# Patient Record
Sex: Female | Born: 1970
Health system: Southern US, Community
[De-identification: ages and names within clinical notes are randomized; demographics above are authoritative.]

## PROBLEM LIST (undated history)

## (undated) DIAGNOSIS — N841 Polyp of cervix uteri: Secondary | ICD-10-CM

## (undated) DIAGNOSIS — G43909 Migraine, unspecified, not intractable, without status migrainosus: Secondary | ICD-10-CM

## (undated) DIAGNOSIS — M545 Low back pain, unspecified: Secondary | ICD-10-CM

## (undated) DIAGNOSIS — M1990 Unspecified osteoarthritis, unspecified site: Secondary | ICD-10-CM

## (undated) HISTORY — PX: CHOLECYSTECTOMY: SHX55

## (undated) HISTORY — DX: Low back pain, unspecified: M54.50

## (undated) HISTORY — DX: Low back pain: M54.5

## (undated) HISTORY — PX: OTHER SURGICAL HISTORY: SHX169

## (undated) HISTORY — DX: Polyp of cervix uteri: N84.1

## (undated) HISTORY — PX: TUBAL LIGATION: SHX77

---

## 2004-02-23 ENCOUNTER — Other Ambulatory Visit: Admission: RE | Admit: 2004-02-23 | Discharge: 2004-02-23 | Payer: Self-pay | Admitting: Obstetrics and Gynecology

## 2004-03-24 ENCOUNTER — Encounter (HOSPITAL_COMMUNITY): Admission: RE | Admit: 2004-03-24 | Discharge: 2004-04-23 | Payer: Self-pay | Admitting: Family Medicine

## 2004-04-27 ENCOUNTER — Encounter (HOSPITAL_COMMUNITY): Admission: RE | Admit: 2004-04-27 | Discharge: 2004-05-27 | Payer: Self-pay | Admitting: Family Medicine

## 2004-09-17 ENCOUNTER — Ambulatory Visit (HOSPITAL_COMMUNITY): Admission: RE | Admit: 2004-09-17 | Discharge: 2004-09-17 | Payer: Self-pay | Admitting: Family Medicine

## 2005-10-27 ENCOUNTER — Ambulatory Visit: Payer: Self-pay | Admitting: Orthopedic Surgery

## 2006-01-11 ENCOUNTER — Ambulatory Visit: Payer: Self-pay | Admitting: Orthopedic Surgery

## 2006-01-24 ENCOUNTER — Encounter: Admission: RE | Admit: 2006-01-24 | Discharge: 2006-01-24 | Payer: Self-pay | Admitting: Orthopedic Surgery

## 2006-02-14 ENCOUNTER — Encounter: Admission: RE | Admit: 2006-02-14 | Discharge: 2006-02-14 | Payer: Self-pay | Admitting: Orthopedic Surgery

## 2006-04-05 ENCOUNTER — Encounter (INDEPENDENT_AMBULATORY_CARE_PROVIDER_SITE_OTHER): Payer: Self-pay | Admitting: Specialist

## 2006-04-05 ENCOUNTER — Ambulatory Visit (HOSPITAL_COMMUNITY): Admission: RE | Admit: 2006-04-05 | Discharge: 2006-04-05 | Payer: Self-pay | Admitting: Obstetrics & Gynecology

## 2007-01-01 ENCOUNTER — Ambulatory Visit: Payer: Self-pay | Admitting: Orthopedic Surgery

## 2007-01-05 ENCOUNTER — Ambulatory Visit (HOSPITAL_COMMUNITY): Admission: RE | Admit: 2007-01-05 | Discharge: 2007-01-05 | Payer: Self-pay | Admitting: Orthopedic Surgery

## 2007-01-11 ENCOUNTER — Ambulatory Visit: Payer: Self-pay | Admitting: Orthopedic Surgery

## 2007-02-01 ENCOUNTER — Ambulatory Visit: Payer: Self-pay | Admitting: Orthopedic Surgery

## 2007-03-19 ENCOUNTER — Ambulatory Visit: Payer: Self-pay | Admitting: Orthopedic Surgery

## 2007-03-29 ENCOUNTER — Ambulatory Visit (HOSPITAL_COMMUNITY): Admission: RE | Admit: 2007-03-29 | Discharge: 2007-03-29 | Payer: Self-pay | Admitting: Orthopedic Surgery

## 2007-04-05 ENCOUNTER — Ambulatory Visit: Payer: Self-pay | Admitting: Orthopedic Surgery

## 2007-04-26 ENCOUNTER — Encounter: Admission: RE | Admit: 2007-04-26 | Discharge: 2007-04-26 | Payer: Self-pay | Admitting: Orthopedic Surgery

## 2007-07-12 ENCOUNTER — Encounter: Admission: RE | Admit: 2007-07-12 | Discharge: 2007-07-12 | Payer: Self-pay | Admitting: Orthopedic Surgery

## 2007-10-23 ENCOUNTER — Encounter: Admission: RE | Admit: 2007-10-23 | Discharge: 2007-10-23 | Payer: Self-pay | Admitting: Orthopedic Surgery

## 2008-02-25 ENCOUNTER — Emergency Department (HOSPITAL_COMMUNITY): Admission: EM | Admit: 2008-02-25 | Discharge: 2008-02-25 | Payer: Self-pay | Admitting: Emergency Medicine

## 2008-03-03 ENCOUNTER — Ambulatory Visit: Payer: Self-pay | Admitting: Internal Medicine

## 2008-03-03 DIAGNOSIS — J309 Allergic rhinitis, unspecified: Secondary | ICD-10-CM | POA: Insufficient documentation

## 2008-03-03 DIAGNOSIS — K5900 Constipation, unspecified: Secondary | ICD-10-CM | POA: Insufficient documentation

## 2008-03-03 DIAGNOSIS — K219 Gastro-esophageal reflux disease without esophagitis: Secondary | ICD-10-CM | POA: Insufficient documentation

## 2008-03-03 DIAGNOSIS — F17200 Nicotine dependence, unspecified, uncomplicated: Secondary | ICD-10-CM

## 2008-03-03 DIAGNOSIS — M545 Low back pain: Secondary | ICD-10-CM

## 2008-03-03 DIAGNOSIS — M129 Arthropathy, unspecified: Secondary | ICD-10-CM | POA: Insufficient documentation

## 2009-04-27 ENCOUNTER — Emergency Department (HOSPITAL_COMMUNITY): Admission: EM | Admit: 2009-04-27 | Discharge: 2009-04-27 | Payer: Self-pay | Admitting: Emergency Medicine

## 2009-04-28 ENCOUNTER — Ambulatory Visit: Payer: Self-pay | Admitting: Internal Medicine

## 2009-05-14 ENCOUNTER — Ambulatory Visit: Payer: Self-pay | Admitting: Internal Medicine

## 2009-05-29 ENCOUNTER — Ambulatory Visit: Payer: Self-pay | Admitting: Internal Medicine

## 2009-06-02 ENCOUNTER — Ambulatory Visit: Payer: Self-pay | Admitting: Family Medicine

## 2009-06-03 ENCOUNTER — Ambulatory Visit: Payer: Self-pay | Admitting: Cardiology

## 2009-06-04 ENCOUNTER — Encounter: Payer: Self-pay | Admitting: Cardiology

## 2009-06-04 ENCOUNTER — Ambulatory Visit: Payer: Self-pay | Admitting: Cardiology

## 2009-06-04 ENCOUNTER — Ambulatory Visit (HOSPITAL_COMMUNITY): Admission: RE | Admit: 2009-06-04 | Discharge: 2009-06-04 | Payer: Self-pay | Admitting: Cardiology

## 2009-06-07 ENCOUNTER — Emergency Department (HOSPITAL_COMMUNITY): Admission: EM | Admit: 2009-06-07 | Discharge: 2009-06-07 | Payer: Self-pay | Admitting: Emergency Medicine

## 2009-06-08 ENCOUNTER — Encounter (INDEPENDENT_AMBULATORY_CARE_PROVIDER_SITE_OTHER): Payer: Self-pay | Admitting: Internal Medicine

## 2009-06-09 ENCOUNTER — Ambulatory Visit: Payer: Self-pay | Admitting: Internal Medicine

## 2009-06-09 DIAGNOSIS — R030 Elevated blood-pressure reading, without diagnosis of hypertension: Secondary | ICD-10-CM

## 2010-04-20 ENCOUNTER — Ambulatory Visit: Payer: Self-pay | Admitting: Orthopedic Surgery

## 2010-04-20 DIAGNOSIS — M19049 Primary osteoarthritis, unspecified hand: Secondary | ICD-10-CM | POA: Insufficient documentation

## 2010-06-01 ENCOUNTER — Ambulatory Visit: Payer: Self-pay | Admitting: Orthopedic Surgery

## 2010-06-26 IMAGING — CR DG CHEST 2V
2 series · 2 of 2 positions shown · non-contrast
Comparison: None

CLINICAL DATA: Coughing up blood

CHEST - 2 VIEW

[view not recorded (1 of 2)]
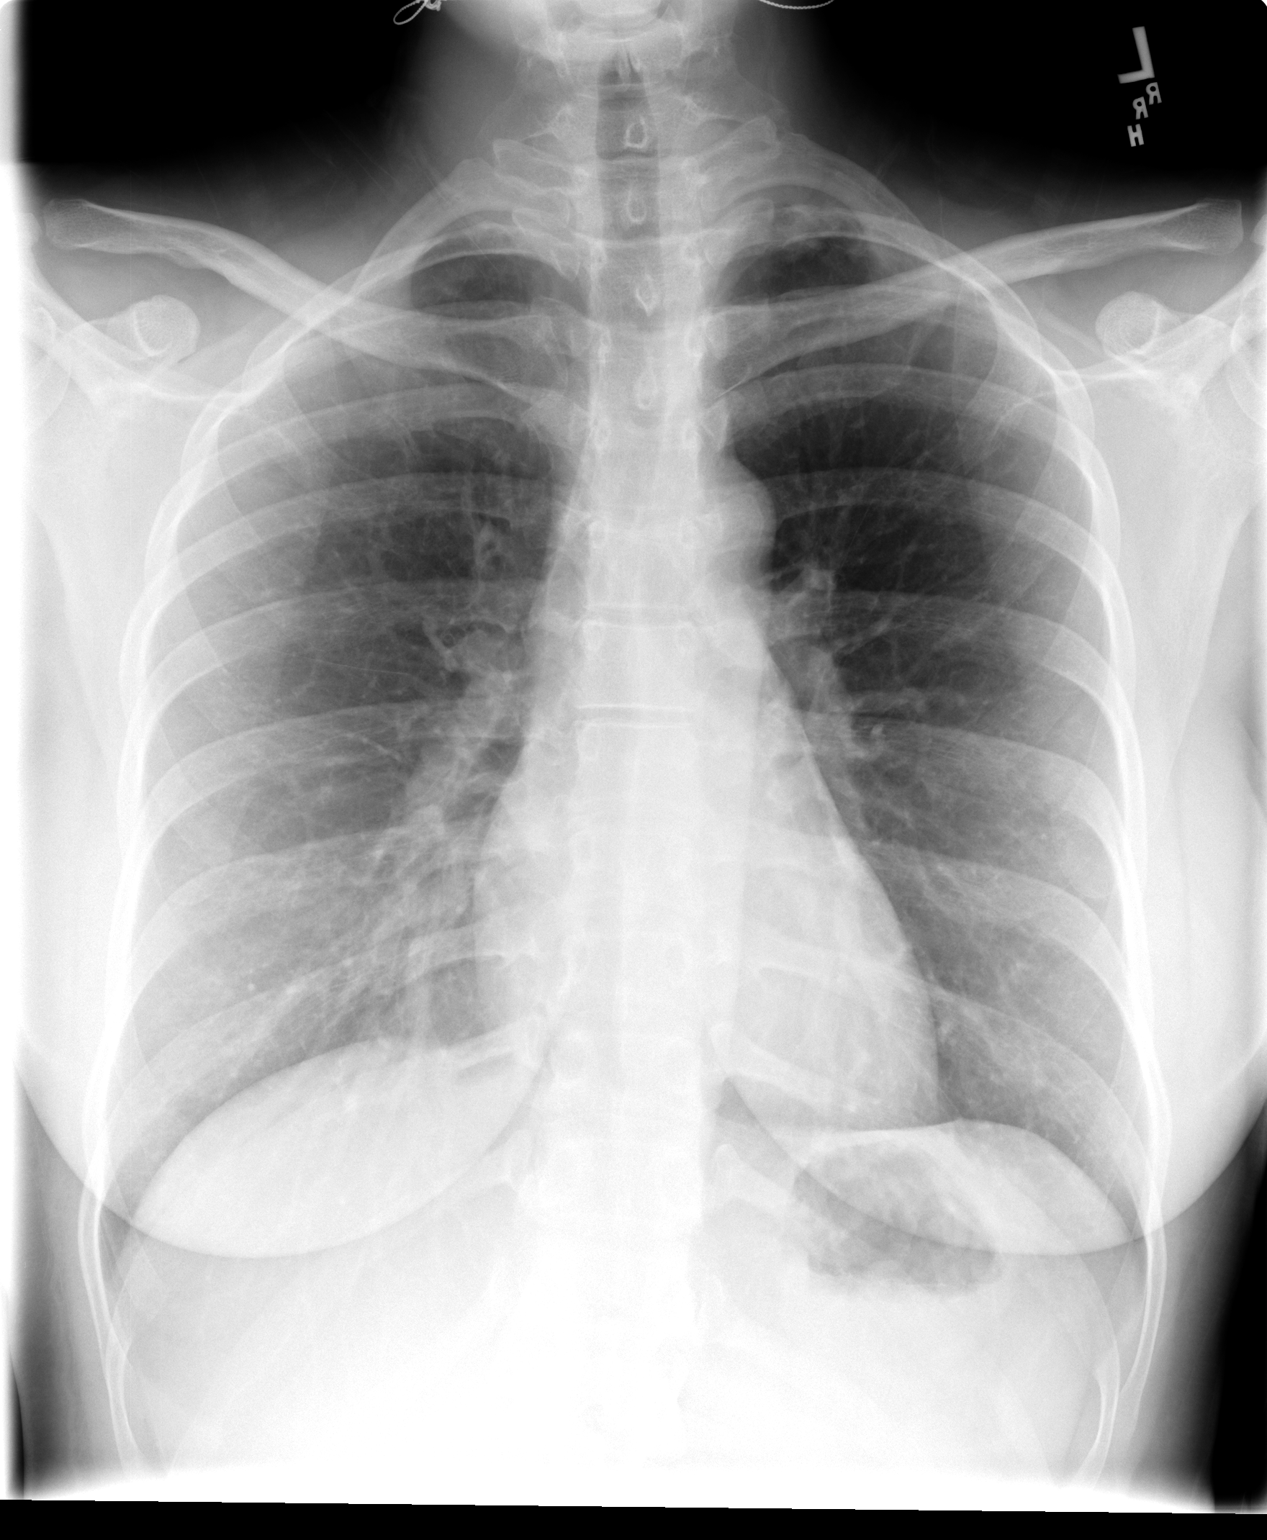

[view not recorded (2 of 2)]
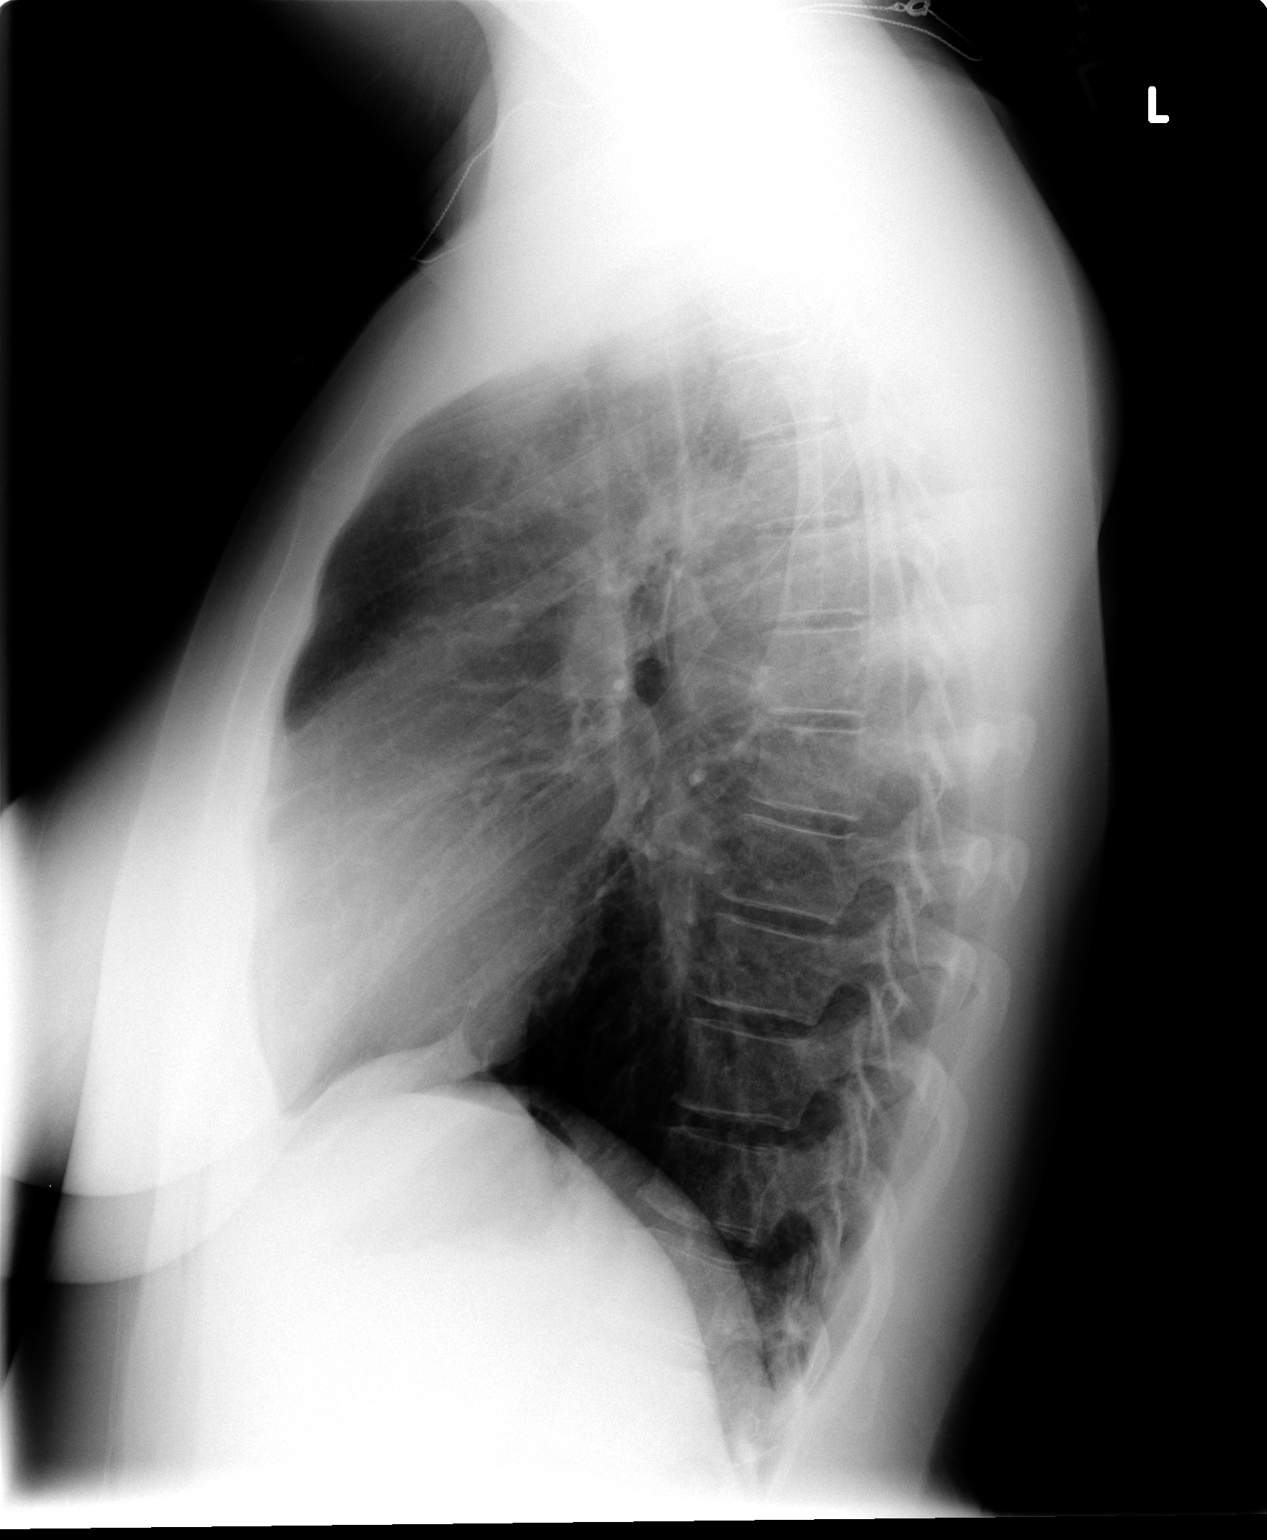

[2 of 2 positions shown; findings below may reference images not displayed]

FINDINGS: The mediastinum and cardiac silhouette.  Costophrenic
angles are clear.  No evidence of effusion, infiltrate, or
pneumothorax.
IMPRESSION: No acute cardiopulmonary process.

## 2010-06-29 ENCOUNTER — Ambulatory Visit: Payer: Self-pay | Admitting: Orthopedic Surgery

## 2010-06-29 DIAGNOSIS — M654 Radial styloid tenosynovitis [de Quervain]: Secondary | ICD-10-CM

## 2010-07-16 ENCOUNTER — Emergency Department (HOSPITAL_COMMUNITY): Admission: EM | Admit: 2010-07-16 | Discharge: 2010-07-16 | Payer: Self-pay | Admitting: Family Medicine

## 2010-08-17 ENCOUNTER — Encounter: Payer: Self-pay | Admitting: Orthopedic Surgery

## 2010-08-26 ENCOUNTER — Ambulatory Visit (HOSPITAL_COMMUNITY): Admission: RE | Admit: 2010-08-26 | Discharge: 2010-08-26 | Payer: Self-pay | Admitting: Family Medicine

## 2011-01-09 ENCOUNTER — Encounter: Payer: Self-pay | Admitting: Obstetrics and Gynecology

## 2011-01-16 LAB — CONVERTED CEMR LAB
CK-MB: 1.8 ng/mL
Relative Index: 0.7
Total CK: 247 U/L — ABNORMAL HIGH
Troponin I: <0.01 ng/mL

## 2011-01-18 NOTE — Assessment & Plan Note (Signed)
Summary: NEW PROB/RT HAND,THUMB PAIN/NEEDS XRAY/UMR/CAF   Visit Type:  new problem Primary Provider:  Erle Crocker MD  CC:  right hand pain.  History of Present Illness: 40 year old female pain in her RIGHT hand for 5 months.  The pain is in the thumb it's at the basal joint is over the RIGHT hand and its throbbing and dull.  She rates a 5/10.  It is constant it is better with splinting worse with constant use.  She does feel some numbness and there is some swelling.  She is employed as a Lawyer  Medications: BC powder, Tylenol, Ibuprofen, Tekturna.  Current Medications (verified): 1)  Tylenol 325 Mg  Tabs (Acetaminophen) .... Two Once Daily As Needed 2)  Bc Headache 325-16-95 Mg  Tabs (Aspirin-Salicylamide-Caffeine) .... One Daily - Almost 3)  Aleve 220 Mg  Tabs (Naproxen Sodium) .... One Two Times A Day As Needed For Cyst Under Left Arm. 4)  Bupropion Hcl 150 Mg Xr12h-Tab (Bupropion Hcl) .Marland Kitchen.. 1 By Mouth Once Daily For 1 Week Than 1 By Mouth Two Times A Day  Allergies (verified): No Known Drug Allergies  Past History:  Past Medical History: Last updated: 03/03/2008 Allergic rhinitis Recurrent low back pain--bulging disc/arthritis/spinal stenosis constipation Cervical polyps  Past Surgical History: Last updated: 03/03/2008 Cholecystectomy Tubal ligation endometrial ablation--for DUB  Family History: Last updated: 04/28/2009 father-53--healthy mother-53--healthy brother-36--healthy son-20 daughter-13-allergies, eczema daughter-11-tonsillectomy  Social History: Last updated: 06/02/2009 Married lives with spouse and children Current Smoker-1/2ppd x 20 years Alcohol use-no Drug use-no 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 Immunology:  Complains of seasonal allergies; denies sinus problems and  allergic to bee stings. Hemoatologic:  Complains of brusing; denies easy bleeding.  The review of systems is negative for Constitutional, Cardiovascular, Respiratory, Gastrointestinal, Genitourinary, Neurologic, Musculoskeletal, Endocrine, Psychiatric, and Skin.  Physical Exam  Additional Exam:   *GEN: appearance was normal   ** CDV: normal pulses temperature and no edema  * LYMPH nodes were normal   * SKIN was normal   * Neuro: normal sensation ** Psyche: AAO x 3 and mood was normal   MSK *Gait was normal  *Inspection tenderness at the base of the metacarpal.  A1 pulley nontender.  Range of motion normal but painful.  In strength slightly weakened.  Carpometacarpal joint is stable    Impression & Recommendations:  Problem # 1:  OSTEOARTHRITIS, CARPOMETACARPAL JOINT, RIGHT THUMB (ICD-715.94) Assessment New  x-rays showed marginal osteophytes at the carpometacarpal joint otherwise normal  Recommend splinting and diclofenac 500 b.i.d. #60 with one refill  Orders: Est. Patient Level IV (78295) Hand x-ray, minimum 3 views (73130)

## 2011-01-18 NOTE — Assessment & Plan Note (Signed)
Summary: 6 WK RE-CK RT HAND/UMR/CAF   Visit Type:  Follow-up Primary Provider:  Erle Crocker MD  CC:  recheck rt hand.  History of Present Illness: 40 year old female pain in her RIGHT hand for 6 months.  The pain is in the thumb it's at the basal joint is over the RIGHT hand and its throbbing and dull.  She rates a 5/10.  It is constant it is better with splinting worse with constant use.  She does feel some numbness and there is some swelling.  She is employed as a Lawyer  Medications: BC powder, Tekturna, Diclofenac.  Today is 6 week recheck after splint and Diclofenac.  Med and brace helped some, not much.  we have not tried an injection yet  I recommended injection today she agreed    Allergies: No Known Drug Allergies  Physical Exam  Additional Exam:  she is definitely having tenderness at the basilar joint of the thumb.  She denies pain with the Finkelstein's maneuver tear there is no numbness or tingling carpal tunnel tests are negative she has a good grip or pinch strength or her  She was awake and alert she was oriented x3 his skin was intact with good pulses and perfusion to the RIGHT upper extremity there is no instability.  No malalignment.     Impression & Recommendations:  Problem # 1:  OSTEOARTHRITIS, CARPOMETACARPAL JOINT, RIGHT THUMB (ICD-715.94) Assessment Deteriorated  thumb injection basal joint 1-1 Depo-Medrol lidocaine tolerated well sterile technique  Orders: Est. Patient Level III (78469) Joint Aspirate / Injection, Small (62952) Depo- Medrol 40mg  (J1030)  Patient Instructions: 1)  You have received an injection of cortisone today. You may experience increased pain at the injection site. Apply ice pack to the area for 20 minutes every 2 hours and take 2 xtra strength tylenol every 8 hours. This increased pain will usually resolve in 24 hours. The injection will take effect in 3-10 days.  2)  continue the splint and the nsaids  3)  f/u 4 weeks

## 2011-01-18 NOTE — Progress Notes (Signed)
Summary: Initial evaluation  Initial evaluation   Imported By: Jacklynn Ganong 04/20/2010 09:33:39  _____________________________________________________________________  External Attachment:    Type:   Image     Comment:   External Document

## 2011-01-18 NOTE — Progress Notes (Signed)
Summary: Progress note  Progress note   Imported By: Julie Barton 04/20/2010 09:34:21  _____________________________________________________________________  External Attachment:    Type:   Image     Comment:   External Document

## 2011-01-18 NOTE — Letter (Signed)
Summary: History form  History form   Imported By: Jacklynn Ganong 04/26/2010 13:28:14  _____________________________________________________________________  External Attachment:    Type:   Image     Comment:   External Document

## 2011-01-18 NOTE — Letter (Signed)
Summary: *Orthopedic No Show Letter  [facility]  [address] [place] [address]  [place], [place] [address]   Phone: [phone]  Fax: [phone]      08/17/2010   Mariabelen Callan [address] [place], [place] [address]   Dear Ms. Protzman,   Our records indicate that you missed your scheduled appointment with Dr. Beaulah Corin on _8/24/11_.  Please contact this office to reschedule your appointment as soon as possible.  It is important that you keep your scheduled appointments with your physician, so we can provide you the best care possible.        Sincerely,    Dr. Terrance Mass, MD [facility] Phone [phone]

## 2011-01-18 NOTE — Assessment & Plan Note (Signed)
Summary: 4 WEK FOL-UP AFTER INJECTION/UMR/BSF   Visit Type:  Follow-up Primary Provider:  Erle Crocker MD  CC:  recheck hand.  History of Present Illness: 40 year old female pain in her RIGHT hand for 6 months.  The pain is in the thumb it's at the basal joint is over the RIGHT hand and its throbbing and dull.  She rates a 5/10.  It is constant it is better with splinting worse with constant use.  She does feel some numbness and there is some swelling.  She is employed as a Lawyer  Medications: BC powder, Tekturna, Diclofenac as needed.  The injection helped alot, some pain still.  It seems like the injection has helped  Examination  She has less tenderness although continued grinding over the involved RIGHT thumb.  She also has some tenderness over the FCR tendon associated with some tendinitis in the wrist  Her pinch strength is excellent her metacarpophalangeal joint was stable there was no swelling around the joint she had normal sensation in the hand and her scan showed some changes of discoloration secondary to injection  Patient will continue with bracing, anti-inflammatory and will followup in 6 weeks    Allergies: No Known Drug Allergies   Impression & Recommendations:  Problem # 1:  OSTEOARTHRITIS, CARPOMETACARPAL JOINT, RIGHT THUMB (ICD-715.94) Assessment Improved  Orders: Est. 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 Patient Level III (98119)  Patient Instructions: 1)  Brace at night  2)  make appointment in 6 weeks check wrist and thumb 3)  take diclofenac

## 2011-03-28 LAB — DIFFERENTIAL
Basophils Relative: 1 % (ref 0–1)
Neutro Abs: 4 10*3/uL (ref 1.7–7.7)
Neutrophils Relative %: 59 % (ref 43–77)

## 2011-03-28 LAB — POCT CARDIAC MARKERS
CKMB, poc: <1 ng/mL — ABNORMAL LOW (ref 1.0–8.0)
Myoglobin, poc: 62.5 ng/mL (ref 12–200)

## 2011-03-28 LAB — CBC
HCT: 35.6 % — ABNORMAL LOW (ref 36.0–46.0)
MCV: 84.3 fL (ref 78.0–100.0)
RBC: 4.23 MIL/uL (ref 3.87–5.11)
RDW: 13.2 % (ref 11.5–15.5)

## 2011-03-28 LAB — BASIC METABOLIC PANEL
CO2: 27 mEq/L (ref 19–32)
GFR calc Af Amer: >60 mL/min (ref >60)
Sodium: 136 mEq/L (ref 135–145)

## 2011-03-29 LAB — DIFFERENTIAL
Basophils Relative: 1 % (ref 0–1)
Eosinophils Absolute: 0.1 10*3/uL (ref 0.0–0.7)
Eosinophils Relative: 1 % (ref 0–5)
Lymphs Abs: 2 10*3/uL (ref 0.7–4.0)
Monocytes Absolute: 0.3 10*3/uL (ref 0.1–1.0)
Neutrophils Relative %: 61 % (ref 43–77)

## 2011-03-29 LAB — CBC
Hemoglobin: 12.9 g/dL (ref 12.0–15.0)
MCHC: 35.1 g/dL (ref 30.0–36.0)
MCV: 85.2 fL (ref 78.0–100.0)
WBC: 6.4 10*3/uL (ref 4.0–10.5)

## 2011-04-01 ENCOUNTER — Encounter: Payer: Self-pay | Admitting: Orthopedic Surgery

## 2011-04-19 ENCOUNTER — Encounter: Payer: Self-pay | Admitting: Orthopedic Surgery

## 2011-04-19 ENCOUNTER — Ambulatory Visit (INDEPENDENT_AMBULATORY_CARE_PROVIDER_SITE_OTHER): Payer: 59 | Admitting: Orthopedic Surgery

## 2011-04-19 VITALS — HR 64 | Resp 16 | Ht 67.0 in | Wt 202.0 lb

## 2011-04-19 DIAGNOSIS — M19049 Primary osteoarthritis, unspecified hand: Secondary | ICD-10-CM

## 2011-04-19 MED ORDER — DICLOFENAC SODIUM 1 % TD GEL: 1.0000 "application " | Freq: Four times a day (QID) | TRANSDERMAL | Status: AC

## 2011-04-19 NOTE — Progress Notes (Signed)
Bilateral hand pain.  Pain at the base of both thumbs. Last time he was RIGHT side. This time as the LEFT side. Pain at the base of the thumb pad, [place], by activity with total aching pain 4/10. Seems to come and go, worse with applying splints at work.  All systems reviewed. All negative, except for occasional shortness of breath, wheezing and cough heartburn, joint pain, swelling, stiffness, and seasonal allergies.  Family history diabetes, and arthritis.  She is tender at the base of both thumbs. Painful range of motion. Decreased pinch strength. Sensation normal. Over normal. Pulses normal. Color is normal.  We decide to go with a prednisone Dosepak and Voltaren gel.  Followup on 1 mo

## 2011-05-03 NOTE — Assessment & Plan Note (Signed)
[facility]                       [facility] OFFICE NOTE   NAME:Julie Barton, Julie Barton                         MRN:          [id number]  DATE:06/03/2009                            DOB:          [birth year]    CHIEF COMPLAINT:  Chest pain and arm hurting.   HISTORY OF PRESENT ILLNESS:  Ms. Poet Hineman is a delightful 40 year old  married African American female who comes today referred by Dr. Erby Pian  and Dr. Jen Mow for persistent chest pain.   About a month ago, she started developing problems with some chest  discomfort.  She coughed up a small blood clot.  Dr. Jen Mow obtained a  chest x-ray, a CBC, and a D-dimer all of which were normal via the  emergency room here at [facility].   Over the last several weeks, she has had some shortness of breath and  some chest discomfort.  She also notes that her left arm aches at times.   She has a cyst under her left armpit, this subsiding now.  This  apparently was treated with some heat and compresses and an antibiotic.   Because of persistent pain, she saw Dr. Erby Pian yesterday in his  office.  Please refer to his thorough note.   At that time, he was concerned and gave her sublingual nitroglycerin  which she really did not have a major effect.  She started O2 at 2 L per  nasal cannula.  She was given aspirin.  Cardiac markers were obtained  which were negative.  He advised her to go to the emergency room, but  she decided against it.   She was referred to me today.   EKG during that office visit, I have reviewed today and it was normal.   This discomfort in her chest as well as in her arm is not precipitated  by exertion.  It seems to be spontaneous.  It even occurs sometimes when  she wakes up in the mornings.   She has some minor nausea but no postprandial nausea or vomiting or  abdominal pain or diarrhea.  She has had no problems swallowing foods or  liquids.  She really does not have true reflux  symptoms.  She was  started on Nexium yesterday empirically to see if that would help which  I agree with.   Her past medical history, her cardiac risk factors include tobacco use  of half pack per day, and she is overweight.  She says her lipids have  never been checked.   She currently has no known drug allergies.  She is on Wellbutrin 150 mg  p.o. b.i.d.   Previous hospitalizations included tubal ligation, endometriosis  ablation.   SOCIAL HISTORY:  She is married, has 3 children.  She lives in [place].  She works at [employer].  She does not drink.  No drug  use.   Family history is negative for premature coronary artery disease.   REVIEW OF SYSTEMS:  All systems were questioned.  She has occasional  headaches.  She has had some palpitations off and on in  the past and was  told she had a heart murmur as a child.  She apparently has a history of  jaundice which is unclear.  Musculoskeletal, lower back pain.  Otherwise, her review of systems are negative.   PHYSICAL EXAMINATION:  She is in no acute distress.  She is very  pleasant, alert and oriented x3.  Skin is warm and dry.  Blood pressure  is 130/87 in the right arm, pulse is 83 and regular.  EKG was repeated  today and is normal except for perhaps some minimal artery  repolarization.  She is 5 feet 7 inches, weighs 197 pounds.  HEENT is  normal.  She wears glasses.  Sclerae are clear, nonicteric.  Facial  symmetry is normal.  Dentition satisfactory.  Neck is supple.  Carotid  upstrokes were equal bilaterally without bruits.  No JVD.  Thyroid is  not enlarged.  No lymphadenopathy.  Chest, lungs are clear to  auscultation percussion.  There is no rub.  Heart reveals a nondisplaced  PMI and normal S1 and S2.  No obvious rub.  Her upper extremity strength  is normal.  Pulses were intact.  Abdominal exam is soft, good bowel  sounds.  No midline bruit.  Extremities reveal no cyanosis, clubbing, or  edema.  Pulses  are intact.  There is no sign of DVT.  Skin is  unremarkable.   ASSESSMENT:  1. Chest discomfort with some aching in her left arm, normal EKG,      negative cardiac markers, negative D-dimer and chest x-ray.  The      only thing left to consider is possible pericarditis or pericardial      effusion.  I suspect that this is not the case either.  I am sure      some of this is probably musculoskeletal tension and stress could      be related to the cyst that was under her left arm.  2. Tobacco use with chronic bronchitis.  We talked about this at      length and how she might have recurrent hemoptysis if she continues      to smoke.  3. Obesity.   PLAN:  A 2-D echocardiogram to assess for any structural heart problems  and/or pericarditis.  If this is negative, I will see her back on a  p.r.n. basis.  I told her to continue the Nexium and reassurance was  given.  Also, encouraged her to stop smoking.     Thomas C. Daleen Squibb, MD, [facility]  Electronically Signed    TCW/MedQ  DD: 06/03/2009  DT: 06/04/2009  Job #: [id number]   cc:   Franchot Heidelberg, M.D.  Erle Crocker, M.D.

## 2011-05-06 NOTE — H&P (Signed)
NAMEFabianna, Keats Barton                  ACCOUNT NO.:  [id number]   MEDICAL RECORD NO.:  [id number]          PATIENT TYPE:  AMB   LOCATION:  DAY                           FACILITY:  [facility]   PHYSICIAN:  Lazaro Arms, M.D.   DATE OF BIRTH:  November 19, 1971   DATE OF ADMISSION:  DATE OF DISCHARGE:  LH                                HISTORY & PHYSICAL   SOCIAL:  [id number].   Julie Barton is a 40 year old female, gravida 4, para 3, abortus 1, status post tubal  ligation who has been having problems with heavy menstrual bleeding now for  several years.  She was seen in our office back in March and was begun on  Megace and her hemoglobin was 10.5.  She was started on Megace and iron.  Did an ultrasound which revealed a thickened endometrial stripe of 2.4 cm  most probably consistent with endometrial polyp.  As a result, she is  admitted for a hysteroscopy and D&C for evaluation and endometrial ablation  for definitive therapy.   PAST MEDICAL HISTORY:  Negative.   PAST SURGICAL HISTORY:  Tubal ligation and gallbladder.   ALLERGIES:  None.   MEDICATIONS:  None except Megace.   REVIEW OF SYSTEMS:  Otherwise negative.   PHYSICAL EXAMINATION:  HEENT:  Unremarkable.  Thyroid is normal.  LUNGS:  Clear.  HEART:  Regular rate and rhythm without murmur, rub, or gallop.  BREASTS:  Without masses, discharge, skin changes.  ABDOMEN:  Benign.  No hepatosplenomegaly or mass.  PELVIC:  She has normal external genitalia.  ____________ discharge.  Cervix  parous without lesions.  Uterus normal in shape and contour, normal and  nontender.  EXTREMITIES:  Warm and no edema.  NEUROLOGICAL:  Grossly intact.   IMPRESSION:  1.  Menometrorrhagia.  2.  Dysmenorrhea.  3.  Anemia resolved.   PLAN:  The patient is admitted for hysteroscopy, D&C, endometrial ablation.  She understands the risks, benefits, indications, alternatives and we will  proceed.      Lazaro Arms, M.D.  Electronically Signed     LHE/MEDQ  D:  04/04/2006  T:  04/04/2006  Job:  [id number]

## 2011-05-06 NOTE — Op Note (Signed)
NAMEGraci, Hulce Valla                  ACCOUNT NO.:  [id number]   MEDICAL RECORD NO.:  [id number]          PATIENT TYPE:  AMB   LOCATION:  DAY                           FACILITY:  [facility]   PHYSICIAN:  Lazaro Arms, M.D.   DATE OF BIRTH:  Aug 05, 1971   DATE OF PROCEDURE:  04/05/2006  DATE OF DISCHARGE:                                 OPERATIVE REPORT   PREOPERATIVE DIAGNOSIS:  1.  Menometrorrhagia.  2.  Dysmenorrhea.  3.  Anemia.   POSTOPERATIVE DIAGNOSIS:  1.  Menometrorrhagia.  2.  Dysmenorrhea.  3.  Anemia.   PROCEDURE:  Hysteroscopy, D&C, endometrial ablation.   SURGEON:  Lazaro Arms, M.D.   ANESTHESIA:  General endotracheal.   FINDINGS:  The patient had a normal endometrial cavity with no polyps seen,  a lot of shaggy endometrium, but there was no polyp or submucosal myomas.   DESCRIPTION OF PROCEDURE:  The patient was taken to the operating room and  placed in the supine position and underwent general endotracheal anesthesia  and placed in the dorsal lithotomy position and prepped and draped in the  usual sterile fashion.  Graves speculum was placed, cervix was grasped, and  paracervical block was placed.  The cervix was dilated serially to allow  hysteroscope.  Hysteroscopy was performed and no abnormalities seen.  Vigorous uterine curettage was performed and good uterine cry was achieved  in all areas.  ThermaChoice III mutual balloon was used.  37 mL of D5W was  required to maintain a pressure between 192 mmHg.  It was heated to 88  degrees Celsius for a total therapy time of 10 minutes and 21 seconds.  All  the fluids were recovered at the end of the procedure.  The patient  tolerated the procedure well.  She experienced minimal blood loss and taken  to the recovery room in good stable condition.  All counts were correct x3.      Lazaro Arms, M.D.  Electronically Signed    LHE/MEDQ  D:  04/05/2006  T:  04/06/2006  Job:  [id number]

## 2011-05-07 ENCOUNTER — Inpatient Hospital Stay (INDEPENDENT_AMBULATORY_CARE_PROVIDER_SITE_OTHER)
Admission: RE | Admit: 2011-05-07 | Discharge: 2011-05-07 | Disposition: A | Discharge status: Home / Self Care | Payer: 59 | Source: Ambulatory Visit | Attending: Family Medicine | Admitting: Family Medicine

## 2011-05-07 DIAGNOSIS — H209 Unspecified iridocyclitis: Secondary | ICD-10-CM

## 2011-05-24 ENCOUNTER — Ambulatory Visit: Payer: 59 | Admitting: Orthopedic Surgery

## 2011-06-02 ENCOUNTER — Ambulatory Visit (INDEPENDENT_AMBULATORY_CARE_PROVIDER_SITE_OTHER): Payer: 59 | Admitting: Orthopedic Surgery

## 2011-06-02 DIAGNOSIS — M19049 Primary osteoarthritis, unspecified hand: Secondary | ICD-10-CM

## 2011-06-02 NOTE — Progress Notes (Signed)
Followup visit osteoarthritis and inflammation in both thumbs  She did not get the Voltaren gel  Still having but I would say with significant pain and difficulty with power pinch RIGHT hand, pain continued LEFT hand, thumb.  Recommend max reason to we can get the Voltaren gel, recommend try different pharmacy with new prescription  Return in one month

## 2011-07-07 ENCOUNTER — Encounter: Payer: Self-pay | Admitting: Orthopedic Surgery

## 2011-07-07 ENCOUNTER — Ambulatory Visit: Payer: 59 | Admitting: Orthopedic Surgery

## 2011-11-02 ENCOUNTER — Other Ambulatory Visit (HOSPITAL_COMMUNITY)
Admission: RE | Admit: 2011-11-02 | Discharge: 2011-11-02 | Disposition: A | Discharge status: Home / Self Care | Payer: 59 | Source: Ambulatory Visit | Attending: Family Medicine | Admitting: Family Medicine

## 2011-11-02 ENCOUNTER — Other Ambulatory Visit: Payer: Self-pay | Admitting: Family Medicine

## 2011-11-02 DIAGNOSIS — Z1159 Encounter for screening for other viral diseases: Secondary | ICD-10-CM | POA: Insufficient documentation

## 2011-11-02 DIAGNOSIS — Z01419 Encounter for gynecological examination (general) (routine) without abnormal findings: Secondary | ICD-10-CM | POA: Insufficient documentation

## 2012-05-06 ENCOUNTER — Emergency Department (HOSPITAL_COMMUNITY)
Admission: EM | Admit: 2012-05-06 | Discharge: 2012-05-06 | Disposition: A | Discharge status: Home / Self Care | Payer: 59 | Source: Home / Self Care | Attending: Emergency Medicine | Admitting: Emergency Medicine

## 2012-05-06 ENCOUNTER — Encounter (HOSPITAL_COMMUNITY): Payer: Self-pay | Admitting: Emergency Medicine

## 2012-05-06 DIAGNOSIS — J45909 Unspecified asthma, uncomplicated: Secondary | ICD-10-CM

## 2012-05-06 DIAGNOSIS — J309 Allergic rhinitis, unspecified: Secondary | ICD-10-CM

## 2012-05-06 DIAGNOSIS — J019 Acute sinusitis, unspecified: Secondary | ICD-10-CM

## 2012-05-06 MED ORDER — METHYLPREDNISOLONE ACETATE 80 MG/ML IJ SUSP
INTRAMUSCULAR | Status: AC
  Filled 2012-05-06: qty 1

## 2012-05-06 MED ORDER — CHLORPHENIRAMINE MALEATE 12 MG PO CP24: 1.0000 | ORAL_CAPSULE | Freq: Every day | ORAL | Status: AC

## 2012-05-06 MED ORDER — ALBUTEROL SULFATE HFA 108 (90 BASE) MCG/ACT IN AERS: 1.0000 | INHALATION_SPRAY | Freq: Four times a day (QID) | RESPIRATORY_TRACT | Status: DC | PRN

## 2012-05-06 MED ORDER — AMOXICILLIN-POT CLAVULANATE 875-125 MG PO TABS: 1.0000 | ORAL_TABLET | Freq: Two times a day (BID) | ORAL | Status: AC

## 2012-05-06 MED ORDER — PREDNISONE 5 MG PO KIT: 1.0000 | PACK | Freq: Every day | ORAL | Status: DC

## 2012-05-06 MED ORDER — FLUTICASONE PROPIONATE 50 MCG/ACT NA SUSP: 2.0000 | Freq: Every day | NASAL | Status: AC

## 2012-05-06 MED ORDER — METHYLPREDNISOLONE ACETATE 80 MG/ML IJ SUSP
80.0000 mg | Freq: Once | INTRAMUSCULAR | Status: AC
  Administered 2012-05-06: 80 mg via INTRAMUSCULAR

## 2012-05-06 NOTE — ED Provider Notes (Signed)
Chief Complaint  Patient presents with  . 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 She also has had postnasal drainage and mild throat irritation, but no severe sore throat. She's also had chest congestion, dry cough, and some wheezing. She has no definite diagnosis of asthma. She does get allergy symptoms every spring time and usually treats this with over-the-counter medications. She denies any fever or chills.  Review of Systems:  Other than noted above, the patient denies any of the following symptoms. Systemic:  No fever, chills, sweats, fatigue, myalgias, headache, or anorexia. Eye:  No redness, itching, watering, pain or drainage. ENT:  No earache, ear congestion, nasal congestion, sneezing, nasal itching, rhinorrhea, sinus pressure, sinus pain, post nasal drip, or sore throat. Lungs:  No cough, sputum production, wheezing, shortness of breath, or chest pain. Skin:  No rash or itching.  PMFSH:  Past medical history, family history, social history, meds, and allergies were reviewed.  No history of asthma. No use of tobacco.  Physical Exam:   Vital signs:  BP 120/85  Pulse 68  Temp(Src) 98.5 F (36.9 C) (Oral)  Resp 18  SpO2 98% General:  Alert, in no distress. Eye:  No conjunctival injection or drainage. Lids were normal. ENT:  TMs and canals were normal, without erythema or inflammation.  Nasal mucosa was congested, pale and boggy with clear drainage.  Mucous membranes were moist.  Pharynx was clear, without exudate or drainage.  There were no oral ulcerations or lesions. Neck:  Supple, no adenopathy, tenderness or mass. Lungs:  No respiratory distress.  She has scattered bilateral expiratory wheezes and rhonchi, but no rales.  Breath sounds were otherwise clear and equal bilaterally. Heart:  Regular rhythm, without gallops,  murmers or rubs. Skin:  Clear, warm, and dry, without rash or lesions.  Course in Urgent Care Center:   She was given Depo-Medrol 80 mg IM and tolerated this well without any immediate side effects.  Assessment:  The primary encounter diagnosis was Allergic rhinitis. Diagnoses of Asthmatic bronchitis and Acute sinusitis were also pertinent to this visit.  Plan:   1.  The following meds were prescribed:   New Prescriptions   ALBUTEROL (PROVENTIL HFA;VENTOLIN HFA) 108 (90 BASE) MCG/ACT INHALER    Inhale 1-2 puffs into the lungs every 6 (six) hours as needed for wheezing.   AMOXICILLIN-CLAVULANATE (AUGMENTIN) 875-125 MG PER TABLET    Take 1 tablet by mouth 2 (two) times daily.   CHLORPHENIRAMINE MALEATE 12 MG CP24    Take 1 capsule (12 mg total) by mouth daily.   FLUTICASONE (FLONASE) 50 MCG/ACT NASAL SPRAY    Place 2 sprays into the nose daily.   PREDNISONE 5 MG KIT    Take 1 kit (5 mg total) by mouth daily after breakfast. Prednisone 5 mg 6 day dosepack.  Take as directed.   2.  The patient was instructed in symptomatic care and handouts were given. 3.  The patient was told to return if becoming worse in any way, if no better in 3 or 4 days, and given some red flag symptoms that would indicate earlier return. 4.  The patient was also instructed in allergen avoidance.  Follow up:  The patient was told to follow up with her primary care physician in 2 weeks.    Reuben Likes, MD 05/06/12 [phone]

## 2012-05-06 NOTE — Discharge Instructions (Signed)
People who suffer from allergies frequently have symptoms of nasal congestion, runny nose, sneezing, itching of the nose, eyes, ears or throat, mucous in the throat, watering of the eyes and cough.  These symptoms are caused by the body's immune response to environmental allergens.  For seasonal allergies this is pollen (tree pollen in the spring, grass pollen in the summer, and weed pollen in the fall).  Year round allergy symptoms are usually caused by dust or mould.  Many people have year round symptoms which are worse seasonally.  For people who have seasonal allergies, pollen avoidance may help to decease symptoms.  This means keeping windows in the house down and windows in the car up.  Run your air conditioning, since this filters out many of the pollen particles.  If you have to spend a prolonged time outdoors during heavy pollen season, it might be prudent to wear a mask.  These can be purchased at any drug store.  When you come in after heavy pollen exposure, your skin, clothing and hair are covered with pollen.  Changing your clothing, taking a shower, and washing your hair may help with your pollen exposure.  Also, your bedding, pillow, and pillowcase may become contaminated with pollen, so frequent washing of your bedding and pillowcase and changing out your pillow may help as well.  (Your pillow can also be a source of dust and mould exposure as well.)  During heavy pollen season (April and September), a large amount of pollen gets trapped in your nasal cavity.  This can contribute to ongoing allergy symptoms.  Saline irrigation of the nasal cavity can help to remove this and relieve allergy symptoms.  This can be accomplished in several ways.  You can mix up your own saline solution using the following recipe:  8 oz of distilled or boiled water, 1/2 tsp of table salt (sodium choride), and a pinch of baking soda (sodium bicarbonate).  If nasal congestion is a problem.  1 to 2 drops of Afrin  solution can be added to this as well.  To do the irrigation, purchase a nasal bulb syringe (the kind you would use to clean out an infant's nose).  Fill this up with the solution, lean you head over a sink with the nostril to be irrigated turned upward, insert the syringe into your nostril, making a tight seal, and gently irrigate, compressing the bulb.  The solution will flow into your nostril and out the other, some may also come out of your mouth.  Repeat this on both sides.  You can do this once daily.  Do not store the solution, mix it up fresh each day.  A commercial solution, called Neomed Solution, can be purchased over the counter without prescription.  You can also use a Netti Pot for irrigation.  These can be purchased at your drug store as well.  Be sure to use distilled or boiled water in these as well and make sure the Netti pot is completely dry between uses.  Over the counter medications can be helpful, and in many cases can completely control allergy symptoms without resorting to more expensive prescription meds.   Antihistamines are the mainstay of allergy treatment.  The newer non-sedating antihistamines are all available over the counter.  These include Allegra, Zyrtec, and Claritin which also can be purchased in their generic forms: fexofenadine, cetirizine, and  Cetirizine.  Combining these meds with a decongestant such as pseudoephedrine or phenylephrine helps with nasal congestion, but decongestants  can also cause elevations in blood pressure.  Pseudoephedrine tends to be more effective than phenylephrine.  The older, more sedating antihistamines such as chlorpheniramine, brompheniramine, and diphenhydramine are also very effective, sometimes more so than the newer antihistamines, but with the price of more sedation.  You should be careful about driving or operating heavy machinery when taking sedating antihistamines, and men with enlarged prostates may experience urinary retention with  diphenhydramine.

## 2012-05-06 NOTE — ED Notes (Signed)
Patient reports symptoms started 3/13- 2 months ago.  Patient reports not seeing a physician during this time frame.  Patient reports antibiotic was called in back in march 2013.  Patient thought symptoms improving, but as of Thursday decided she was getting worse.  Runny nose, throat drainage, chest congestion.  Denies fever.  Sore throat earlier in the week.  Intermittent left ear pain.

## 2013-01-02 ENCOUNTER — Other Ambulatory Visit (HOSPITAL_COMMUNITY): Payer: Self-pay | Admitting: Family Medicine

## 2013-01-02 DIAGNOSIS — Z139 Encounter for screening, unspecified: Secondary | ICD-10-CM

## 2013-01-07 ENCOUNTER — Ambulatory Visit (HOSPITAL_COMMUNITY)
Admission: RE | Admit: 2013-01-07 | Discharge: 2013-01-07 | Disposition: A | Discharge status: Home / Self Care | Payer: 59 | Source: Ambulatory Visit | Attending: Family Medicine | Admitting: Family Medicine

## 2013-01-07 DIAGNOSIS — Z139 Encounter for screening, unspecified: Secondary | ICD-10-CM

## 2013-01-07 DIAGNOSIS — Z1231 Encounter for screening mammogram for malignant neoplasm of breast: Secondary | ICD-10-CM | POA: Insufficient documentation

## 2013-01-14 ENCOUNTER — Other Ambulatory Visit: Payer: Self-pay | Admitting: Family Medicine

## 2013-01-14 DIAGNOSIS — R928 Other abnormal and inconclusive findings on diagnostic imaging of breast: Secondary | ICD-10-CM

## 2013-01-23 ENCOUNTER — Ambulatory Visit (HOSPITAL_COMMUNITY)
Admission: RE | Admit: 2013-01-23 | Discharge: 2013-01-23 | Disposition: A | Discharge status: Home / Self Care | Payer: 59 | Source: Ambulatory Visit | Attending: Family Medicine | Admitting: Family Medicine

## 2013-01-23 DIAGNOSIS — R928 Other abnormal and inconclusive findings on diagnostic imaging of breast: Secondary | ICD-10-CM | POA: Insufficient documentation

## 2013-01-25 ENCOUNTER — Emergency Department (HOSPITAL_COMMUNITY)
Admission: EM | Admit: 2013-01-25 | Discharge: 2013-01-25 | Disposition: A | Discharge status: Home / Self Care | Payer: 59 | Source: Home / Self Care | Attending: Family Medicine | Admitting: Family Medicine

## 2013-01-25 ENCOUNTER — Encounter (HOSPITAL_COMMUNITY): Payer: Self-pay | Admitting: Emergency Medicine

## 2013-01-25 ENCOUNTER — Emergency Department (INDEPENDENT_AMBULATORY_CARE_PROVIDER_SITE_OTHER): Payer: 59

## 2013-01-25 DIAGNOSIS — J45909 Unspecified asthma, uncomplicated: Secondary | ICD-10-CM

## 2013-01-25 DIAGNOSIS — J45901 Unspecified asthma with (acute) exacerbation: Secondary | ICD-10-CM

## 2013-01-25 MED ORDER — ALBUTEROL SULFATE (5 MG/ML) 0.5% IN NEBU
5.0000 mg | INHALATION_SOLUTION | Freq: Once | RESPIRATORY_TRACT | Status: AC
  Administered 2013-01-25: 5 mg via RESPIRATORY_TRACT

## 2013-01-25 MED ORDER — METHYLPREDNISOLONE 4 MG PO KIT: PACK | ORAL | Status: DC

## 2013-01-25 MED ORDER — MOXIFLOXACIN HCL 400 MG PO TABS: 400.0000 mg | ORAL_TABLET | Freq: Every day | ORAL | Status: DC

## 2013-01-25 MED ORDER — IPRATROPIUM BROMIDE 0.02 % IN SOLN
0.5000 mg | Freq: Once | RESPIRATORY_TRACT | Status: AC
  Administered 2013-01-25: 0.5 mg via RESPIRATORY_TRACT

## 2013-01-25 MED ORDER — ALBUTEROL SULFATE (5 MG/ML) 0.5% IN NEBU
INHALATION_SOLUTION | RESPIRATORY_TRACT | Status: AC
  Filled 2013-01-25: qty 1

## 2013-01-25 NOTE — ED Provider Notes (Signed)
History     CSN: [id number]  Arrival date & time 01/25/13  1316   First MD Initiated Contact with Patient 01/25/13 1330      Chief Complaint  Patient presents with  . URI    (Consider location/radiation/quality/duration/timing/severity/associated sxs/prior treatment) Patient is a 42 y.o. female presenting with cough. The history is provided by the patient.  Cough This is a new problem. The current episode started yesterday. The problem has been gradually worsening. The cough is productive of sputum. The maximum temperature recorded prior to her arrival was 101 to 101.9 F. Associated symptoms include chills, rhinorrhea, myalgias and wheezing. She is a smoker. Her past medical history is significant for bronchitis.    Past Medical History  Diagnosis Date  . Allergic rhinitis   . Recurrent low back pain     bulging disk/ arthritis / spinal stenosis   . Constipation   . Cervical polyp     Past Surgical History  Procedure Date  . Cholecystectomy   . Tubal ligation   . Endometrial ablation-- for dub     Family History  Problem Relation Age of Onset  . Arthritis    . Diabetes    . Eczema      History  Substance Use Topics  . Smoking status: Current Every Day Smoker -- 0.5 packs/day    Types: Cigarettes  . Smokeless tobacco: Not on file  . Alcohol Use: No    OB History    Grav Para Term Preterm Abortions TAB SAB Ect Mult Living                  Review of Systems  Constitutional: Positive for fever and chills.  HENT: Positive for congestion and rhinorrhea.   Respiratory: Positive for cough and wheezing.   Gastrointestinal: Negative for nausea, vomiting and diarrhea.  Musculoskeletal: Positive for myalgias.    Allergies  Review of patient's allergies indicates no known allergies.  Home Medications   Current Outpatient Rx  Name  Route  Sig  Dispense  Refill  . ALBUTEROL SULFATE HFA 108 (90 BASE) MCG/ACT IN AERS   Inhalation   Inhale 1-2 puffs into the  lungs every 6 (six) hours as needed for wheezing.   1 Inhaler   3   . FLUTICASONE PROPIONATE 50 MCG/ACT NA SUSP   Nasal   Place 2 sprays into the nose daily.   16 g   3   . MUCINEX FAST-MAX COLD FLU PO   Oral   Take by mouth.         . ACETAMINOPHEN 325 MG PO TABS   Oral   Take 650 mg by mouth. Take 2 tablets by mouth daily as needed          . ASPIRIN-SALICYLAMIDE-CAFFEINE 325-95-16 MG PO TABS   Oral   Take by mouth daily.           . BUPROPION HCL ER (SMOKING DET) 150 MG PO TB12   Oral   Take 150 mg by mouth as directed. Take one tablet by mouth once daily for 1 week  Than 1 by mouth two times a day           . CHLORPHENIRAMINE MALEATE ER 12 MG PO CP24   Oral   Take 1 capsule (12 mg total) by mouth daily.   30 each   3   . DICLOFENAC SODIUM 1 % TD GEL   Topical   Apply 1 application topically 4 (four) times  daily.   5 Tube   3   . IBUPROFEN 200 MG PO TABS   Oral   Take 200 mg by mouth every 6 (six) hours as needed.           . METHYLPREDNISOLONE 4 MG PO KIT      follow package directions, start on sat, take until finished.   21 tablet   0   . MOXIFLOXACIN HCL 400 MG PO TABS   Oral   Take 1 tablet (400 mg total) by mouth daily.   7 tablet   0   . NAPROXEN SODIUM 220 MG PO TABS   Oral   Take 220 mg by mouth 2 (two) times daily with a meal. Take one tablet by mouth two times a day as needed for cyst under arm          . PREDNISONE 5 MG PO KIT   Oral   Take 1 kit (5 mg total) by mouth daily after breakfast. Prednisone 5 mg 6 day dosepack.  Take as directed.   1 kit   0     BP 146/83  Pulse 74  Temp 98.3 F (36.8 C) (Oral)  Resp 18  SpO2 99%  Physical Exam  Nursing note and vitals reviewed. Constitutional: She is oriented to person, place, and time. She appears well-developed and well-nourished.  HENT:  Head: Normocephalic.  Right Ear: External ear normal.  Left Ear: External ear normal.  Mouth/Throat: Oropharynx is clear and  moist.  Eyes: Conjunctivae normal are normal. Pupils are equal, round, and reactive to light.  Neck: Normal range of motion.  Cardiovascular: Normal rate, regular rhythm, normal heart sounds and intact distal pulses.   Pulmonary/Chest: Effort normal. She has wheezes. She has rales.  Neurological: She is alert and oriented to person, place, and time.  Skin: Skin is warm and dry.    ED Course  Procedures (including critical care time)  Labs Reviewed - No data to display Dg Chest 2 View  01/25/2013  *RADIOLOGY REPORT*  Clinical Data: Cough, fever, and chest congestion. Chest pain.  CHEST - 2 VIEW  Comparison: 04/27/2009  Findings: There is slight peribronchial thickening consistent with bronchitis.  No infiltrates or effusions.  Heart size and vascularity are normal.  No osseous abnormality.  IMPRESSION: Bronchitic changes.   Original Report Authenticated By: Francene Boyers, M.D.      1. Acute asthmatic bronchitis       MDM  Sx improved after neb        Linna Hoff, MD 01/25/13 [phone]

## 2013-01-25 NOTE — ED Notes (Signed)
Onset 01/24/13.  Initially noticed chest burning, then cough, followed by fever and chills (temp 102)general aches

## 2013-01-25 NOTE — ED Notes (Signed)
Patient not ready for discharge, treatment to be given

## 2014-03-24 IMAGING — US US BREAST BILATERAL
1 series · 14 of 14 positions shown · non-contrast
Comparison: With priors

On physical exam, I do not palpate a mass in either breast.

CLINICAL DATA: Abnormal bilateral screening mammogram

BILATERAL BREAST ULTRASOUND

[Series 1: us breast bilateral · 0.08mm/px · 14 of 14 slices shown]
[im 1/14]
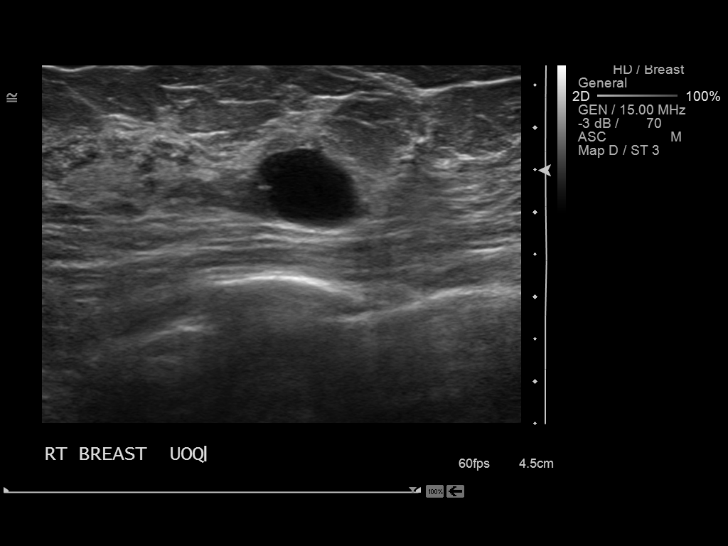
[im 2/14]
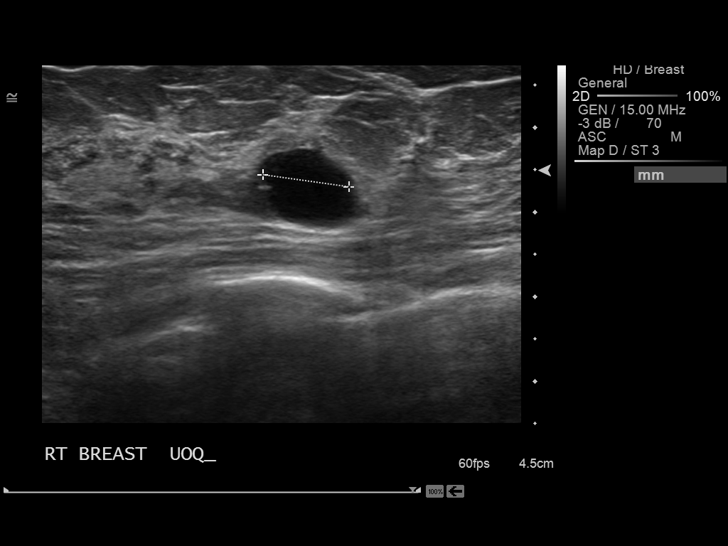
[im 3/14]
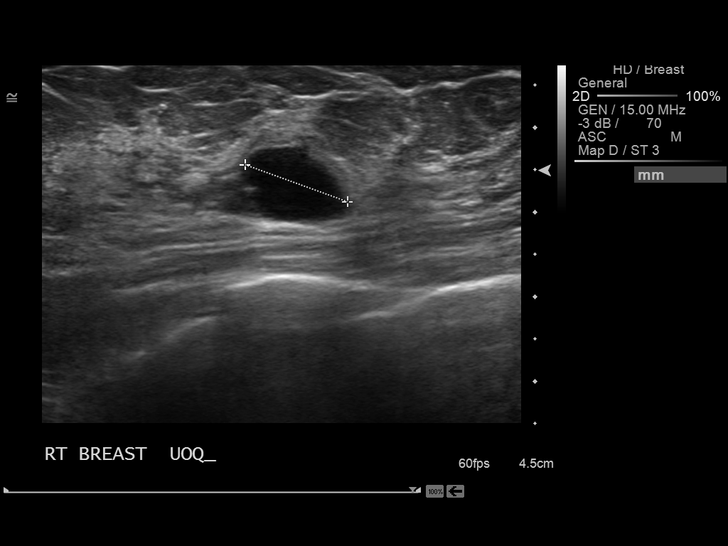
[im 4/14]
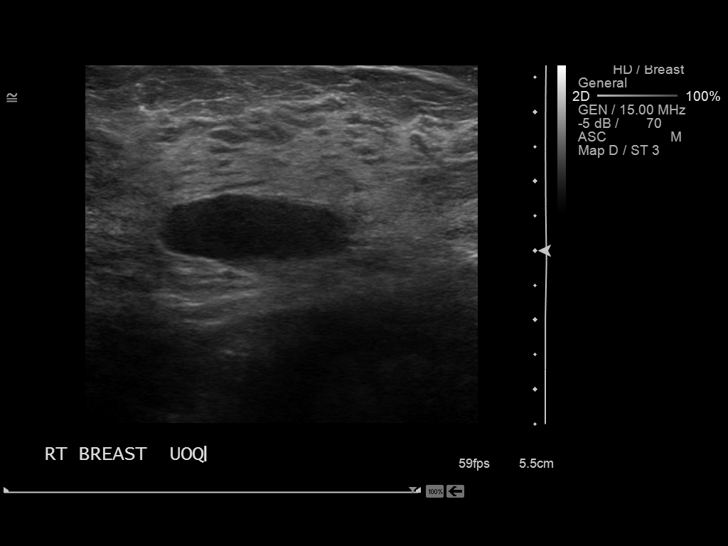
[im 5/14]
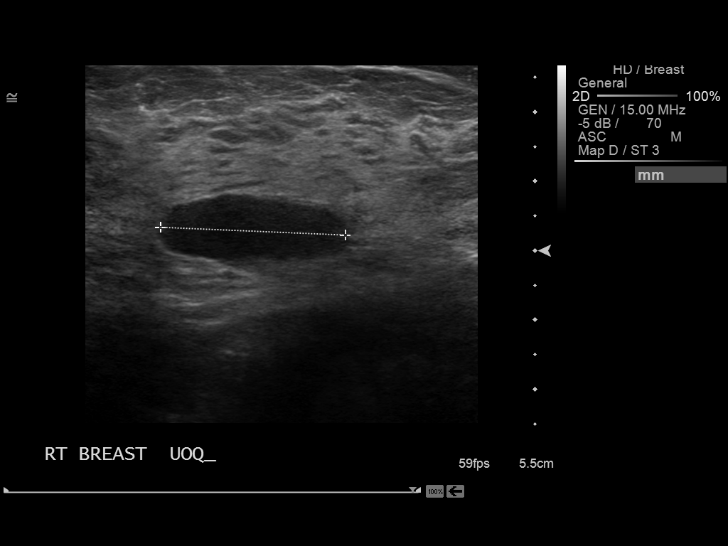
[im 6/14]
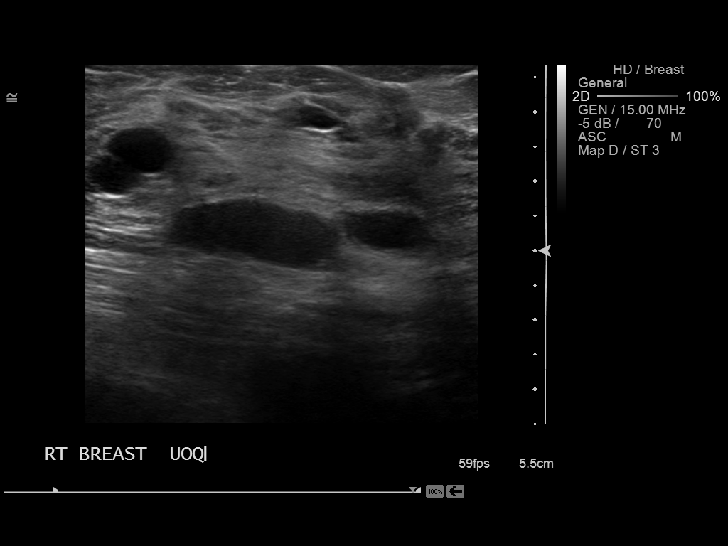
[im 7/14]
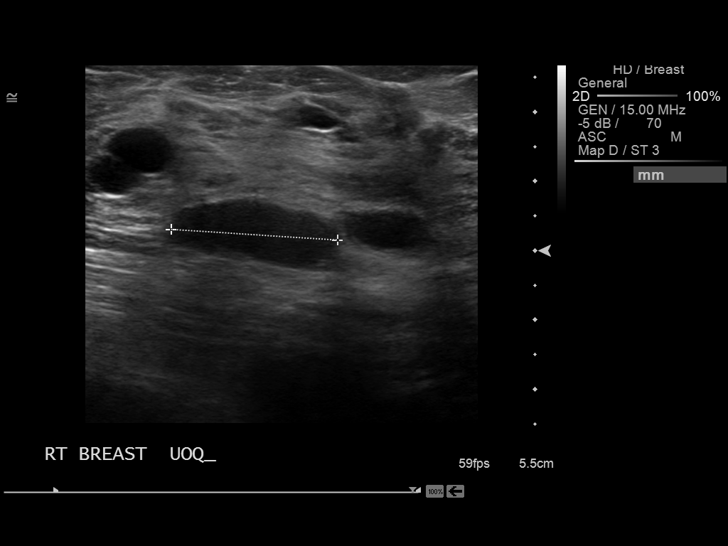
[im 8/14]
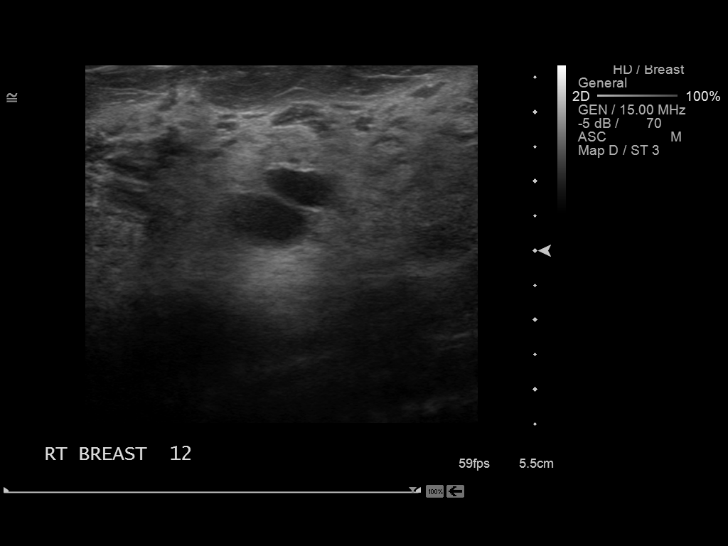
[im 9/14]
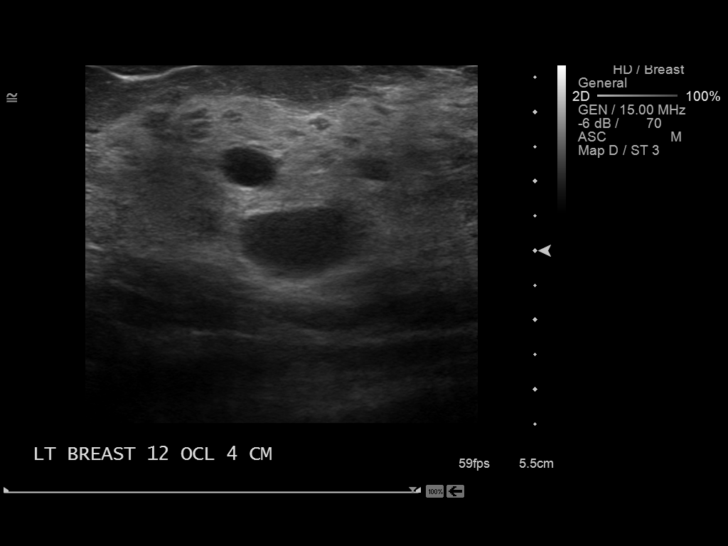
[im 10/14]
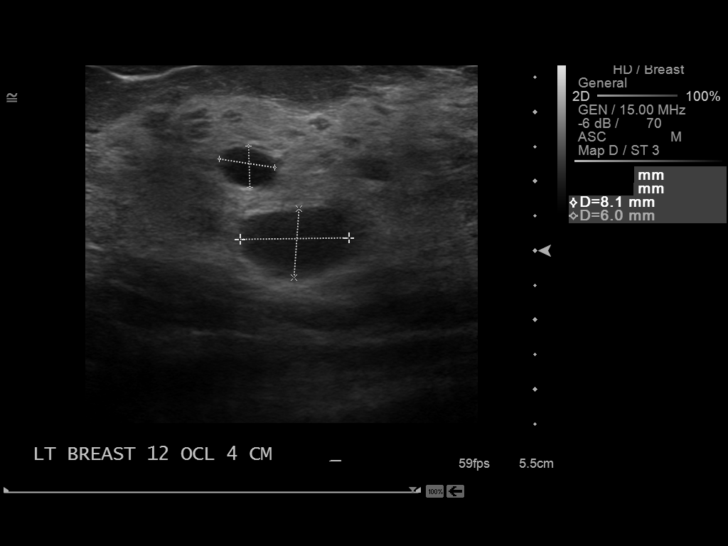
[im 11/14]
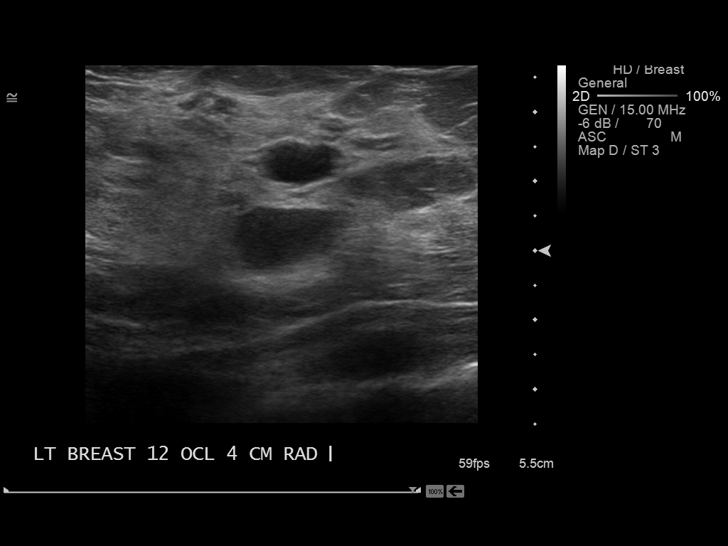
[im 12/14]
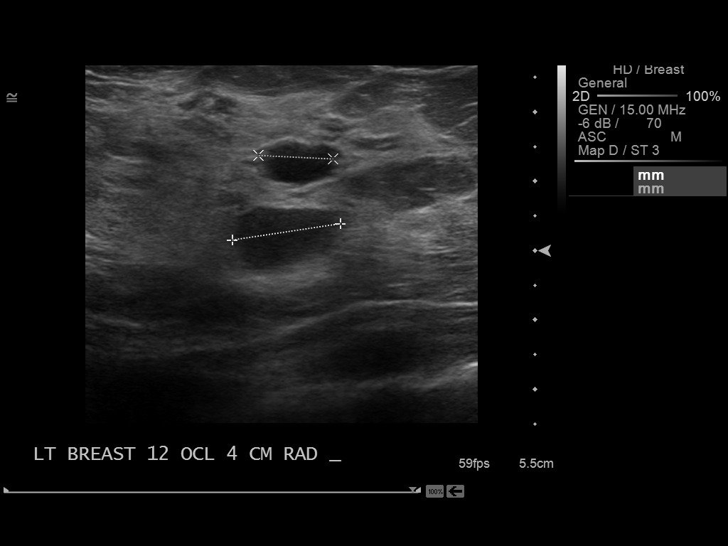
[im 13/14]
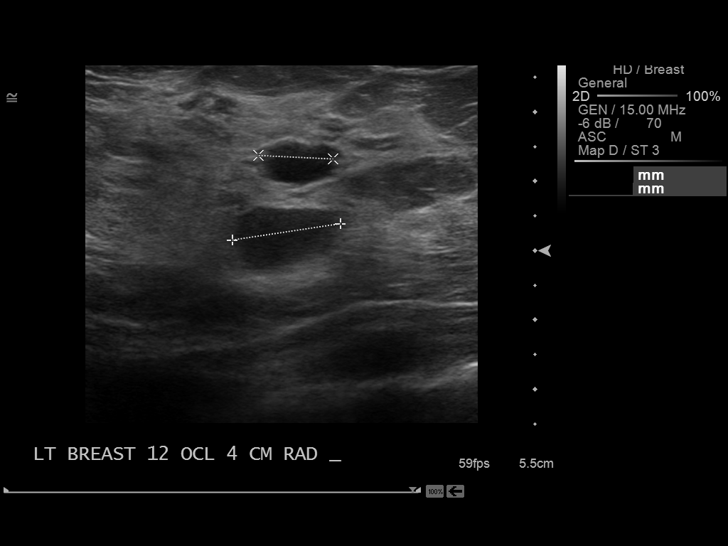
[im 14/14]
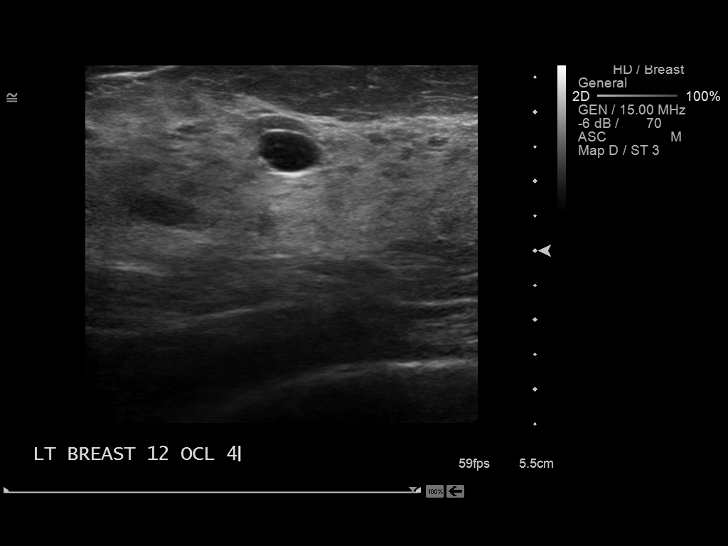

[14 of 14 positions shown; findings below may reference images not displayed]

FINDINGS: Ultrasound is performed, showing there are multiple cysts
in both breast.  There is no solid mass in either breast.  The
largest cyst in the right breast is in the upper outer quadrant
measuring  2.7 cm.  There are multiple smaller cysts in the right
breast.  The largest cyst in the left breast is at 12 o'clock
measuring 1.6 cm.
IMPRESSION: Bilateral breast cysts.  No evidence of malignancy.

RECOMMENDATION:
Screening mammogram in 1 year is recommended.

I have discussed the findings and recommendations with the patient.
Results were also provided in writing at the conclusion of the
visit.

BI-RADS CATEGORY 2:  Benign finding(s).

## 2014-05-09 ENCOUNTER — Other Ambulatory Visit (HOSPITAL_COMMUNITY): Payer: Self-pay | Admitting: Family Medicine

## 2014-05-09 DIAGNOSIS — R229 Localized swelling, mass and lump, unspecified: Principal | ICD-10-CM

## 2014-05-09 DIAGNOSIS — IMO0002 Reserved for concepts with insufficient information to code with codable children: Secondary | ICD-10-CM

## 2014-05-21 ENCOUNTER — Ambulatory Visit (HOSPITAL_COMMUNITY)
Admission: RE | Admit: 2014-05-21 | Discharge: 2014-05-21 | Disposition: A | Discharge status: Home / Self Care | Payer: 59 | Source: Ambulatory Visit | Attending: Family Medicine | Admitting: Family Medicine

## 2014-05-21 ENCOUNTER — Other Ambulatory Visit (HOSPITAL_COMMUNITY): Payer: Self-pay | Admitting: Family Medicine

## 2014-05-21 DIAGNOSIS — IMO0002 Reserved for concepts with insufficient information to code with codable children: Secondary | ICD-10-CM

## 2014-05-21 DIAGNOSIS — N6009 Solitary cyst of unspecified breast: Secondary | ICD-10-CM | POA: Insufficient documentation

## 2014-05-21 DIAGNOSIS — R229 Localized swelling, mass and lump, unspecified: Principal | ICD-10-CM

## 2015-07-20 IMAGING — US US BREAST LTD UNI LEFT INC AXILLA
1 series · 5 of 5 positions shown · non-contrast
Comparison: Previous mammograms.

CLINICAL DATA: 43-year-old female with and a palpable abnormality
in the right breast for the past month and also focal pain in the
left breast.

EXAM:
DIGITAL DIAGNOSTIC  BILATERAL MAMMOGRAM WITH CAD
ULTRASOUND BILATERAL BREAST

[Series 1: us breast ltd uni left inc axilla · 0.07mm/px · 5 of 5 slices shown]
[im 1/5]
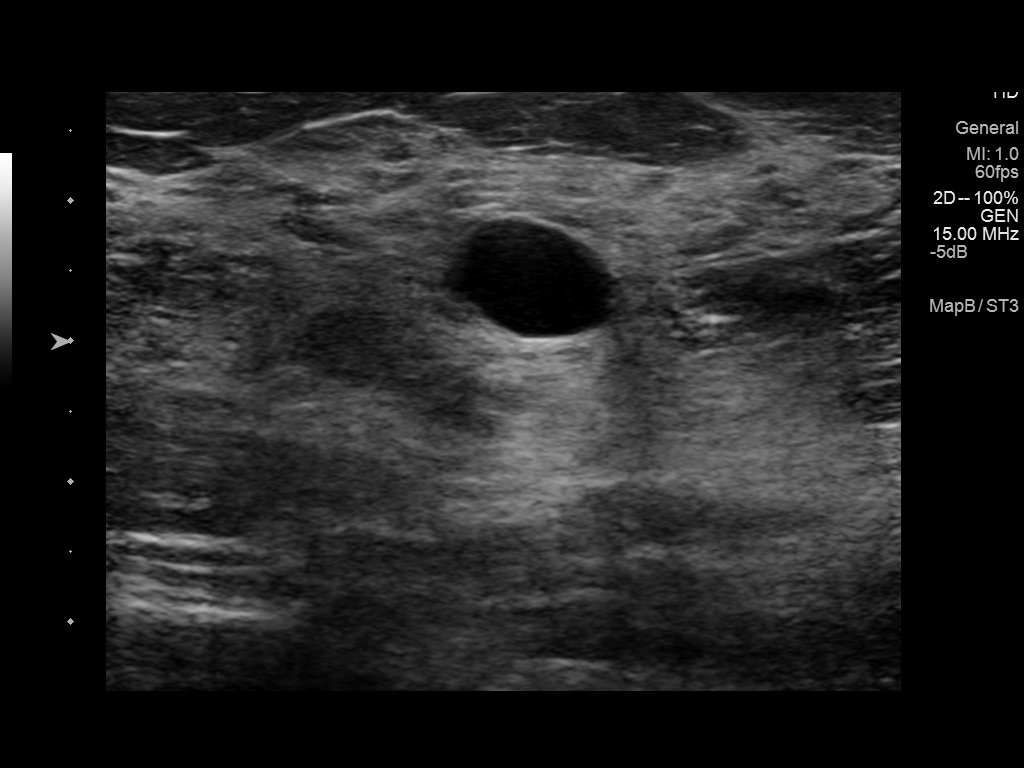
[im 2/5]
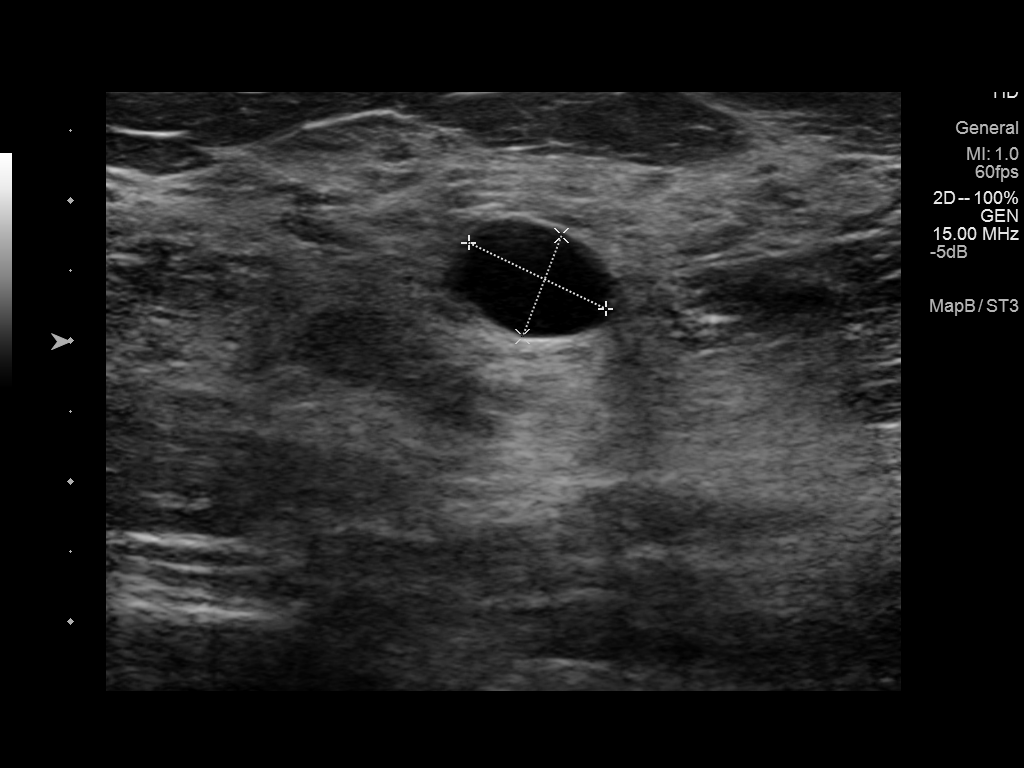
[im 3/5]
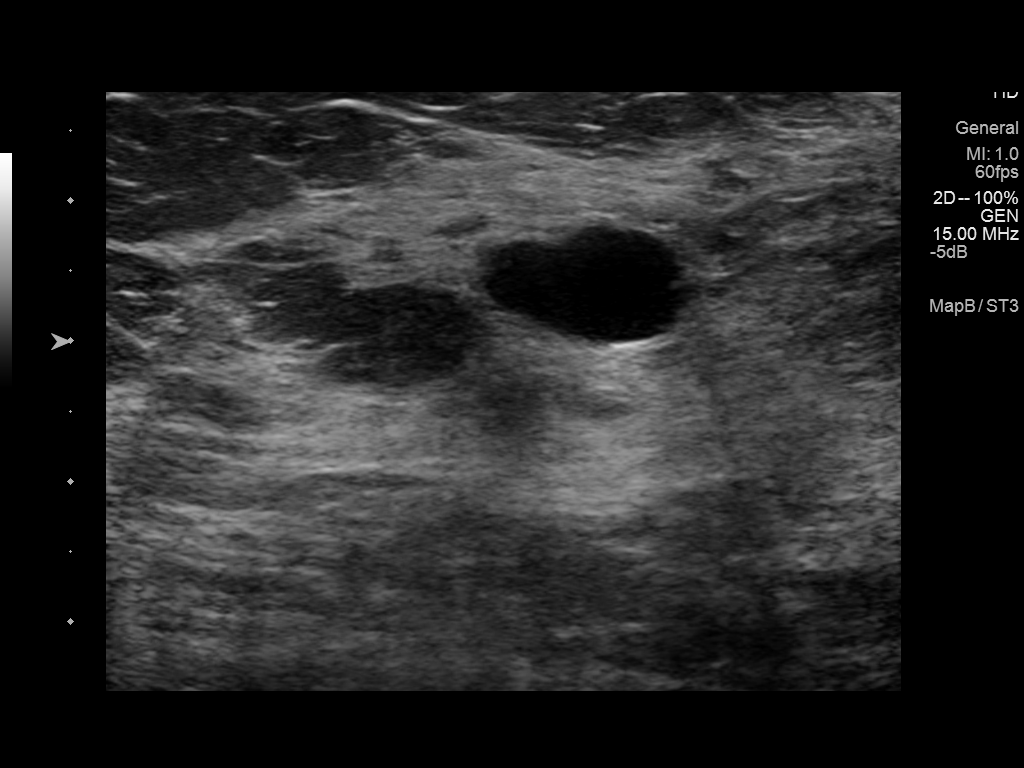
[im 4/5]
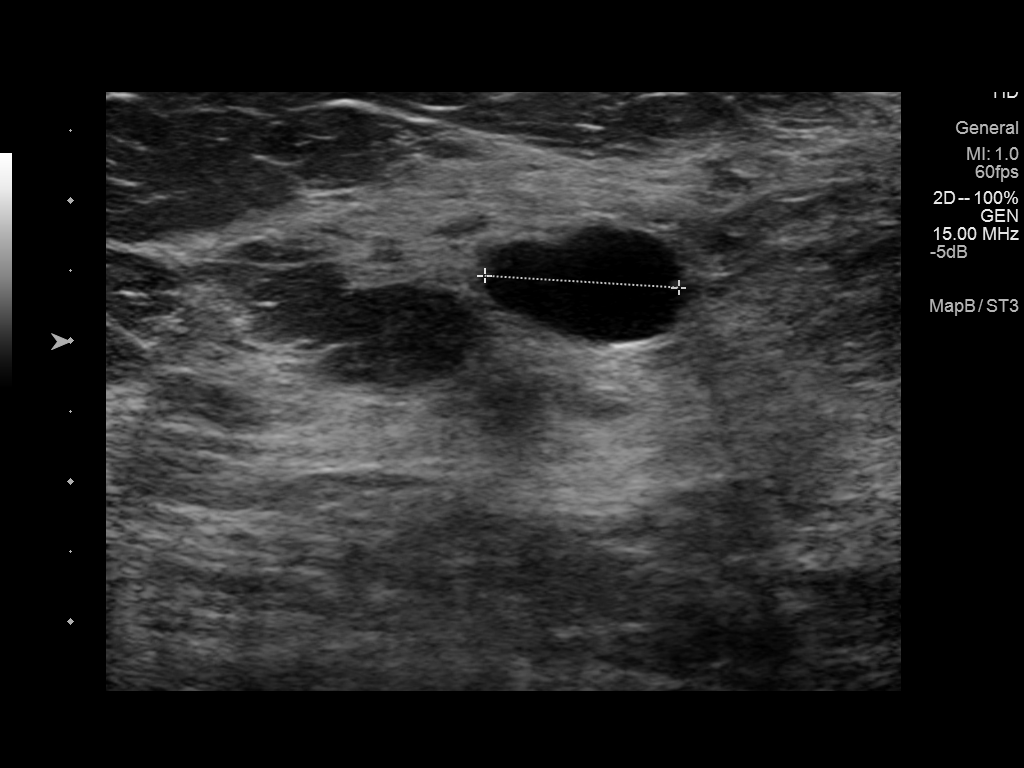
[im 5/5]
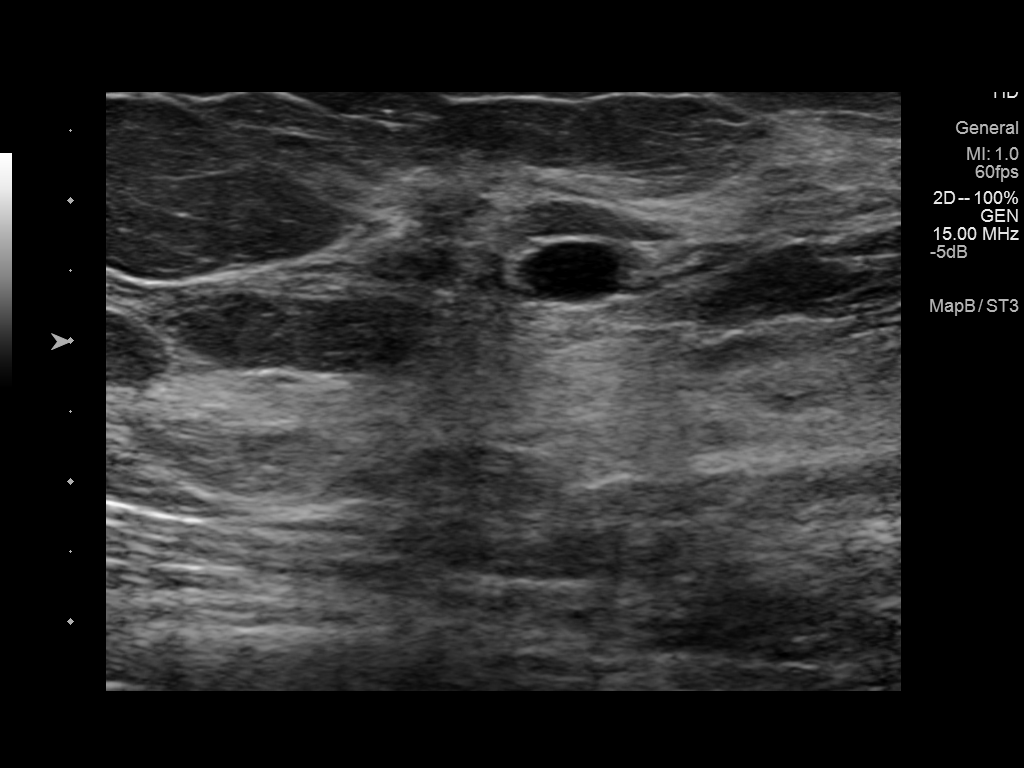

[5 of 5 positions shown; findings below may reference images not displayed]

ACR Breast Density Category c: The breast tissue is heterogeneously
dense, which may obscure small masses.
FINDINGS: There are bilateral oval similar-appearing masses, which appear
circumscribed with partially obscured margins, compatible with
fluctuating cysts. A spot compression tangential view over the
palpable site of concern in the right breast was performed
demonstrating at least 1 oval partially circumscribed mass in the
outer right breast measuring approximately 2 cm. A spot compression
tangential view over the focally painful site of concern in the
outer left breast was performed, with no mammographic abnormalities
seen in this location.

Mammographic images were processed with CAD.

Physical examination at site of palpable concern in the outer right
breast reveals a firm mobile mass at the approximate 10 o'clock
position.

Targeted ultrasound of the right breast was performed demonstrating
innumerable cysts in the upper-outer right breast. A cyst in the
right breast at 10 o'clock corresponding to the site of palpable
concern measures 1.9 x 1.1 x 1.8 cm. Two additional cysts in the
right breast posterior depth at 930 measure 1.5 x 1.0 by 1.4 cm and
1.7 x 1.1 x 1.7 cm.

Targeted ultrasound of the left breast was performed demonstrating a
cyst at 1 o'clock 3 cm from the nipple measuring 1.1 x 0.8 x 1.4 cm.
Additional smaller cysts are also seen in the upper-outer left
breast. This may potentially account for the patient's pain.
IMPRESSION: 1. Right breast cyst at 10 o'clock corresponding to the palpable
abnormality. Innumerable additional cysts are also seen in the outer
right breast.

2. Left breast cyst at 1 o'clock in the region of patient's focal
pain. Several smaller additional cysts are also seen in the
upper-outer left breast.

3. There is no mammographic evidence of malignancy in either breast.

RECOMMENDATION:
Screening mammogram in one year.(Code:CS-C-9PL)

I have discussed the findings and recommendations with the patient.
Results were also provided in writing at the conclusion of the
visit. If applicable, a reminder letter will be sent to the patient
regarding the next appointment.

BI-RADS CATEGORY  2: Benign.

## 2016-11-14 DIAGNOSIS — F17210 Nicotine dependence, cigarettes, uncomplicated: Secondary | ICD-10-CM | POA: Diagnosis not present

## 2016-11-14 DIAGNOSIS — I10 Essential (primary) hypertension: Secondary | ICD-10-CM | POA: Diagnosis not present

## 2017-01-18 DIAGNOSIS — I10 Essential (primary) hypertension: Secondary | ICD-10-CM | POA: Diagnosis not present

## 2017-01-18 DIAGNOSIS — E785 Hyperlipidemia, unspecified: Secondary | ICD-10-CM | POA: Diagnosis not present

## 2017-06-20 DIAGNOSIS — K2950 Unspecified chronic gastritis without bleeding: Secondary | ICD-10-CM | POA: Diagnosis not present

## 2017-06-20 DIAGNOSIS — K2980 Duodenitis without bleeding: Secondary | ICD-10-CM | POA: Diagnosis not present

## 2017-09-12 MED FILL — BYSTOLIC 5 MG TABLET: 5 | 90 days supply | Qty: 90 | Fill #0

## 2018-05-02 DIAGNOSIS — J020 Streptococcal pharyngitis: Secondary | ICD-10-CM | POA: Diagnosis not present

## 2018-05-16 MED FILL — BYSTOLIC 5 MG TABLET: 5 | 90 days supply | Qty: 90 | Fill #0

## 2018-06-06 DIAGNOSIS — K644 Residual hemorrhoidal skin tags: Secondary | ICD-10-CM | POA: Diagnosis not present

## 2018-06-06 DIAGNOSIS — Z01419 Encounter for gynecological examination (general) (routine) without abnormal findings: Secondary | ICD-10-CM | POA: Diagnosis not present

## 2018-11-29 MED FILL — BYSTOLIC 5 MG TABLET: 5 | 90 days supply | Qty: 90 | Fill #1

## 2019-06-17 MED FILL — BYSTOLIC 5 MG TABLET: 5 | 90 days supply | Qty: 90 | Fill #0

## 2019-07-23 DIAGNOSIS — Z20828 Contact with and (suspected) exposure to other viral communicable diseases: Secondary | ICD-10-CM | POA: Diagnosis not present

## 2019-12-18 MED FILL — BYSTOLIC 5 MG TABLET: 5 | 90 days supply | Qty: 90 | Fill #1

## 2020-04-20 MED FILL — BYSTOLIC 5 MG TABLET: 5 | 90 days supply | Qty: 90 | Fill #0

## 2020-06-05 ENCOUNTER — Ambulatory Visit
Admission: EM | Admit: 2020-06-05 | Discharge: 2020-06-05 | Disposition: A | Discharge status: Home / Self Care | Payer: No Typology Code available for payment source

## 2020-06-05 ENCOUNTER — Other Ambulatory Visit: Payer: Self-pay

## 2020-06-05 ENCOUNTER — Encounter: Payer: Self-pay | Admitting: Emergency Medicine

## 2020-06-05 DIAGNOSIS — H20021 Recurrent acute iridocyclitis, right eye: Secondary | ICD-10-CM

## 2020-06-05 HISTORY — DX: Unspecified osteoarthritis, unspecified site: M19.90

## 2020-06-05 HISTORY — DX: Migraine, unspecified, not intractable, without status migrainosus: G43.909

## 2020-06-05 MED ORDER — PREDNISOLONE ACETATE 1 % OP SUSP: 1.0000 [drp] | Freq: Four times a day (QID) | OPHTHALMIC | 0 refills | Status: AC

## 2020-06-05 MED ORDER — OFLOXACIN 0.3 % OP SOLN: 1.0000 [drp] | Freq: Four times a day (QID) | OPHTHALMIC | 0 refills | Status: AC

## 2020-06-05 NOTE — ED Provider Notes (Signed)
[facility]   [id number] 06/05/20 Arrival Time: 1146  CC: Red eye  SUBJECTIVE:  Julie Barton is a 49 y.o. female who presents to the urgent care with a complaint of right eye redness watery eye discharge and pain for the past few days.  Denies a precipitating event, trauma, or close contacts with similar symptoms.  Has not tried any OTC medication.  Symptoms are made worse with light.  Denies similar symptoms in the past.  Denies fever, chills, nausea, vomiting,   halos, , itching, vision changes, double vision, FB sensation, periorbital erythema.     Denies contact lens use.    ROS: As per HPI.  All other pertinent ROS negative.     Past Medical History:  Diagnosis Date   Allergic rhinitis    Arthritis    Cervical polyp    Constipation    Migraines    Recurrent low back pain    bulging disk/ arthritis / spinal stenosis    Past Surgical History:  Procedure Laterality Date   CHOLECYSTECTOMY     endometrial ablation-- for DUB     TUBAL LIGATION     No Known Allergies No current facility-administered medications on file prior to encounter.   Current Outpatient Medications on File Prior to Encounter  Medication Sig Dispense Refill   nebivolol (BYSTOLIC) 5 MG tablet Take 5 mg by mouth daily.     acetaminophen (TYLENOL) 325 MG tablet Take 650 mg by mouth. Take 2 tablets by mouth daily as needed      albuterol (PROVENTIL HFA;VENTOLIN HFA) 108 (90 BASE) MCG/ACT inhaler Inhale 1-2 puffs into the lungs every 6 (six) hours as needed for wheezing. 1 Inhaler 3   Aspirin-Salicylamide-Caffeine (BC HEADACHE) 325-95-16 MG TABS Take by mouth daily.       buPROPion (ZYBAN) 150 MG 12 hr tablet Take 150 mg by mouth as directed. Take one tablet by mouth once daily for 1 week  Than 1 by mouth two times a day       Chlorpheniramine Maleate 12 MG CP24 Take 1 capsule (12 mg total) by mouth daily. 30 each 3   diclofenac sodium (VOLTAREN) 1 % GEL Apply 1 application  topically 4 (four) times daily. 5 Tube 3   fluticasone (FLONASE) 50 MCG/ACT nasal spray Place 2 sprays into the nose daily. 16 g 3   ibuprofen (ADVIL,MOTRIN) 200 MG tablet Take 200 mg by mouth every 6 (six) hours as needed.       methylPREDNISolone (MEDROL DOSEPAK) 4 MG tablet follow package directions, start on sat, take until finished. 21 tablet 0   moxifloxacin (AVELOX) 400 MG tablet Take 1 tablet (400 mg total) by mouth daily. 7 tablet 0   naproxen sodium (ANAPROX) 220 MG tablet Take 220 mg by mouth 2 (two) times daily with a meal. Take one tablet by mouth two times a day as needed for cyst under arm      Phenylephrine-DM-GG-APAP (MUCINEX FAST-MAX COLD FLU PO) Take by mouth.     PredniSONE 5 MG KIT Take 1 kit (5 mg total) by mouth daily after breakfast. Prednisone 5 mg 6 day dosepack.  Take as directed. 1 kit 0   [DISCONTINUED] cetirizine (ZYRTEC) 10 MG tablet Take 10 mg by mouth daily.     [DISCONTINUED] fexofenadine (ALLEGRA) 180 MG tablet Take 180 mg by mouth daily.     [DISCONTINUED] Loratadine (CLARITIN PO) Take by mouth.     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    Tobacco Use   Smoking status: Current Every Day Smoker    Packs/day: 0.50    Types: Cigarettes   Smokeless tobacco: Never Used  Substance and Sexual Activity   Alcohol use: No   Drug use: No   Sexual activity: Not on file  Other Topics Concern   Not on file  Social History Narrative   Not on file   Social Determinants of Health   Financial Resource Strain:    Difficulty of Paying Living Expenses:   Food Insecurity:    Worried About Charity fundraiser in the Last Year:    Arboriculturist in the Last Year:   Transportation Needs:    Lexicographer (Medical):    Lack of Transportation (Non-Medical):   Physical Activity:    Days of Exercise per Week:    Minutes of Exercise per Session:   Stress:    Feeling of Stress :   Social Connections:    Frequency of Communication with Friends and Family:    Frequency of Social Gatherings with Friends and Family:    Attends Religious Services:    Active Member of Clubs or Organizations:    Attends Music therapist:    Marital Status:   Intimate Partner Violence:    Fear of Current or Ex-Partner:    Emotionally Abused:    Physically Abused:    Sexually Abused:    Family History  Problem Relation Age of Onset   Arthritis Other    Diabetes Other    Eczema Other     OBJECTIVE:    Visual Acuity  Right Eye Distance:   Left Eye Distance:   Bilateral Distance:    Right Eye Near:   Left Eye Near:    Bilateral Near:      Vitals:   06/05/20 1153 06/05/20 1202  BP: (!) 145/96   Pulse: 80   Resp: 16   Temp: 98.7 F (37.1 C)   TempSrc: Oral   SpO2: 99%   Weight:  200 lb 9.9 oz (91 kg)  Height:  '5\' 7"'$  (1.702 m)      Physical Activity:    Days of Exercise per Week:    Minutes of Exercise per Session:    Physical Exam Vitals and nursing note reviewed.  Constitutional:      General: She is not in acute distress.    Appearance: Normal appearance. She is normal weight. She is not ill-appearing, toxic-appearing or diaphoretic.  Eyes:     General: Lids are normal. Lids are everted, no foreign bodies appreciated. Vision grossly intact. Gaze aligned appropriately.        Right eye: Discharge present. No foreign body or hordeolum.        Left eye: No foreign body, discharge or hordeolum.     Conjunctiva/sclera:     Right eye: Right conjunctiva is not injected. No chemosis, exudate or hemorrhage.    Left eye: Left conjunctiva is not injected. No chemosis, exudate or hemorrhage.    Comments: Clear watery discharge with  eye redness    Cardiovascular:     Rate and Rhythm: Normal rate and regular rhythm.     Pulses: Normal pulses.  Heart sounds: Normal heart sounds. No murmur heard.  No friction rub. No gallop.   Pulmonary:     Effort: Pulmonary effort is normal. No respiratory distress.     Breath sounds: Normal breath sounds. No stridor. No wheezing, rhonchi or rales.  Chest:     Chest wall: No tenderness.  Neurological:     Mental Status: She is alert.     ASSESSMENT & PLAN:  1. Recurrent iritis of right eye     Meds ordered this encounter  Medications   prednisoLONE acetate (PRED FORTE) 1 % ophthalmic suspension    Sig: Place 1 drop into the right eye 4 (four) times daily.    Dispense:  5 mL    Refill:  0   ofloxacin (OCUFLOX) 0.3 % ophthalmic solution    Sig: Place 1 drop into the right eye 4 (four) times daily.    Dispense:  5 mL    Refill:  0     Discharge instructions Use medication as prescribed and to completion Use OTC systane or genteal gel eye drops at night as needed for symptomatic relief Use OTC ibuprofen or tylenol as needed for pain relief Return here or follow up with ophthamolgy if symptoms persists or worsen such as fever, chills, redness, swelling, eye pain, painful eye movements, vision changes, etc...  Reviewed expectations re: course of current medical issues. Questions answered. Outlined signs and symptoms indicating need for more acute intervention. Patient verbalized understanding. After Visit Summary given.   Emerson Monte, FNP 06/05/20 1251

## 2020-06-05 NOTE — ED Triage Notes (Addendum)
Pain, watering and light sensitivity to RT eye. Hx of iritis, was given steroid eye drops last time.

## 2020-06-05 NOTE — Discharge Instructions (Addendum)
Use medication as prescribed and to completion Use OTC systane or genteal gel eye drops at night as needed for symptomatic relief Use OTC ibuprofen or tylenol as needed for pain relief Return here or follow up with ophthamolgy if symptoms persists or worsen such as fever, chills, redness, swelling, eye pain, painful eye movements, vision changes, etc..Marland Kitchen

## 2020-07-16 MED FILL — BYSTOLIC 5 MG TABLET: 5 | 90 days supply | Qty: 90 | Fill #1

## 2020-11-09 ENCOUNTER — Other Ambulatory Visit (HOSPITAL_COMMUNITY): Payer: Self-pay | Admitting: Family Medicine

## 2020-11-09 MED FILL — NEBIVOLOL HCL 5 MG TABS: 5 | 90 days supply | Qty: 90 | Fill #0

## 2021-03-18 ENCOUNTER — Encounter: Payer: Self-pay | Admitting: Orthopedic Surgery

## 2021-03-18 ENCOUNTER — Other Ambulatory Visit: Payer: Self-pay

## 2021-03-18 ENCOUNTER — Other Ambulatory Visit: Payer: Self-pay | Admitting: Orthopedic Surgery

## 2021-03-18 ENCOUNTER — Ambulatory Visit: Payer: No Typology Code available for payment source | Admitting: Orthopedic Surgery

## 2021-03-18 ENCOUNTER — Ambulatory Visit: Payer: No Typology Code available for payment source

## 2021-03-18 VITALS — Ht 67.0 in | Wt 196.0 lb

## 2021-03-18 DIAGNOSIS — M1711 Unilateral primary osteoarthritis, right knee: Secondary | ICD-10-CM

## 2021-03-18 DIAGNOSIS — G8929 Other chronic pain: Secondary | ICD-10-CM

## 2021-03-18 DIAGNOSIS — M25561 Pain in right knee: Secondary | ICD-10-CM

## 2021-03-18 DIAGNOSIS — M25461 Effusion, right knee: Secondary | ICD-10-CM

## 2021-03-18 DIAGNOSIS — M1710 Unilateral primary osteoarthritis, unspecified knee: Secondary | ICD-10-CM

## 2021-03-18 MED ORDER — MELOXICAM 7.5 MG PO TABS: 7.5000 mg | ORAL_TABLET | Freq: Every day | ORAL | 5 refills | Status: DC

## 2021-03-18 MED FILL — MELOXICAM 7.5 MG TABLET: 7.5 | 30 days supply | Qty: 30 | Fill #0

## 2021-03-18 NOTE — Progress Notes (Signed)
NEW PROBLEM//OFFICE VISIT  Summary assessment and plan:   Osteoarthritis with effusion, start meloxicam aspiration injection follow-up in 4 weeks  Chief Complaint  Patient presents with  . 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  All other systems reviewed and are negative.    Past Medical History:  Diagnosis Date  . Allergic rhinitis   . Arthritis   . Cervical polyp   . Constipation   . Migraines   . Recurrent low back pain    bulging disk/ arthritis / spinal stenosis     Past Surgical History:  Procedure Laterality Date  . CHOLECYSTECTOMY    . endometrial ablation-- for DUB    . TUBAL LIGATION      Family History  Problem Relation Age of Onset  . Arthritis Other   . Diabetes Other   . Eczema Other    Social History   Tobacco Use  . Smoking status: Current Every Day Smoker    Packs/day: 0.50    Types: Cigarettes  . Smokeless tobacco: Never Used  Substance Use Topics  . Alcohol use: No  . Drug use: No    No Known Allergies  Current Meds  Medication Sig  . acetaminophen (TYLENOL) 325 MG tablet Take 650 mg by mouth. Take 2 tablets by mouth daily as needed  . Aspirin-Salicylamide-Caffeine 161-09-60 MG TABS Take by mouth daily.  . diclofenac sodium (VOLTAREN) 1 % GEL Apply 1 application topically 4 (four) times daily.  . fluticasone (FLONASE) 50 MCG/ACT nasal spray Place 2 sprays into the nose daily.  Marland Kitchen ibuprofen (ADVIL,MOTRIN) 200 MG tablet Take 200 mg by mouth every 6 (six) hours as needed.  . meloxicam (MOBIC) 7.5 MG tablet Take 1 tablet (7.5 mg total) by mouth daily.  . naproxen sodium (ANAPROX) 220 MG tablet Take 220 mg by mouth 2 (two) times daily  with a meal. Take one tablet by mouth two times a day as needed for cyst under arm  . nebivolol (BYSTOLIC) 5 MG tablet Take 5 mg by mouth daily.  Marland Kitchen ofloxacin (OCUFLOX) 0.3 % ophthalmic solution Place 1 drop into the right eye 4 (four) times daily.  . prednisoLONE acetate (PRED FORTE) 1 % ophthalmic suspension Place 1 drop into the right eye 4 (four) times daily.  . [DISCONTINUED] buPROPion (ZYBAN) 150 MG 12 hr tablet Take 150 mg by mouth as directed. Take one tablet by mouth once daily for 1 week  Than 1 by mouth two times a day  . [DISCONTINUED] methylPREDNISolone (MEDROL DOSEPAK) 4 MG tablet follow package directions, start on sat, take until finished.  . [DISCONTINUED] moxifloxacin (AVELOX) 400 MG tablet Take 1 tablet (400 mg total) by mouth daily.  . [DISCONTINUED] Phenylephrine-DM-GG-APAP (MUCINEX FAST-MAX COLD FLU PO) Take by mouth.  . [DISCONTINUED] PredniSONE 5 MG KIT Take 1 kit (5 mg total) by mouth daily after breakfast. Prednisone 5 mg 6 day dosepack.  Take as directed.    Ht _0  (1.702 m)   Wt 196 lb (88.9 kg)   BMI 30.70 kg/m   Physical Exam  General appearance: Well-developed well-nourished no gross deformities  Cardiovascular normal pulse and perfusion normal color without  edema  Neurologically o sensation loss or deficits or pathologic reflexes  Psychological: Awake alert and oriented x3 mood and affect normal  Skin no lacerations or ulcerations no nodularity no palpable masses, no erythema or nodularity  Musculoskeletal:   Left knee looks quiet there is no effusion the skin looks clean neurovascular exam is intact she has good range of motion and no instability of the leg  Right knee however has a large effusion she can only bend it just past 90 degrees she can straighten it she has tenderness medially and laterally     MEDICAL DECISION MAKING  A.  Encounter Diagnosis  Name Primary?  . Chronic pain of right knee Yes    B. DATA  ANALYSED:   IMAGING: Interpretation of images: X-ray was done in our office she has arthritis in the lateral compartment the tibial spines of peak the arthritis looks mild  Orders: Injection right knee  Outside records reviewed: None   C. MANAGEMENT   Aspiration injection right knee  With her consent and timeout taken the right knee was aspirated from a lateral approach we used ethyl chloride and alcohol and throughout 30 cc of clear yellow fluid injected 6 mg of Celestone  Meds ordered this encounter  Medications  . meloxicam (MOBIC) 7.5 MG tablet    Sig: Take 1 tablet (7.5 mg total) by mouth daily.    Dispense:  30 tablet    Refill:  5   Follow-up in 4 weeks   Arther Abbott, MD  03/18/2021 3:50 PM

## 2021-03-20 ENCOUNTER — Other Ambulatory Visit (HOSPITAL_COMMUNITY): Payer: Self-pay

## 2021-03-30 ENCOUNTER — Other Ambulatory Visit (HOSPITAL_COMMUNITY): Payer: Self-pay

## 2021-04-19 ENCOUNTER — Other Ambulatory Visit (HOSPITAL_COMMUNITY): Payer: Self-pay

## 2021-04-19 ENCOUNTER — Other Ambulatory Visit: Payer: Self-pay

## 2021-04-19 ENCOUNTER — Ambulatory Visit (INDEPENDENT_AMBULATORY_CARE_PROVIDER_SITE_OTHER): Payer: No Typology Code available for payment source | Admitting: Orthopedic Surgery

## 2021-04-19 ENCOUNTER — Encounter: Payer: Self-pay | Admitting: Orthopedic Surgery

## 2021-04-19 VITALS — BP 139/91 | HR 77 | Ht 67.0 in | Wt 195.0 lb

## 2021-04-19 DIAGNOSIS — Z720 Tobacco use: Secondary | ICD-10-CM

## 2021-04-19 DIAGNOSIS — M1711 Unilateral primary osteoarthritis, right knee: Secondary | ICD-10-CM | POA: Diagnosis not present

## 2021-04-19 DIAGNOSIS — M25561 Pain in right knee: Secondary | ICD-10-CM

## 2021-04-19 DIAGNOSIS — M1710 Unilateral primary osteoarthritis, unspecified knee: Secondary | ICD-10-CM

## 2021-04-19 DIAGNOSIS — M25461 Effusion, right knee: Secondary | ICD-10-CM | POA: Diagnosis not present

## 2021-04-19 DIAGNOSIS — G8929 Other chronic pain: Secondary | ICD-10-CM

## 2021-04-19 MED ORDER — MELOXICAM 7.5 MG PO TABS
ORAL_TABLET | Freq: Every day | ORAL | 5 refills | Status: AC
  Filled 2021-04-19: qty 30, 30d supply, fill #0

## 2021-04-19 MED ORDER — PREDNISONE 10 MG PO TABS
ORAL_TABLET | ORAL | 0 refills | Status: AC
  Filled 2021-04-19: qty 48, 84d supply, fill #0

## 2021-04-19 NOTE — Progress Notes (Signed)
Chief Complaint  Patient presents with  . Knee Pain    Right/ better but still has off/ on pain / daily pain but not as severe    Patient was seen in March of this year for osteoarthritis and effusion right knee had aspiration injection and started meloxicam she is here for 4-week follow-up with improvement in her symptoms but still having some pain from her arthritis  Physical Exam Constitutional:      General: She is not in acute distress.    Appearance: She is well-developed.     Comments: Well developed, well nourished Normal grooming and hygiene     Cardiovascular:     Comments: No peripheral edema Musculoskeletal:     Comments: Right knee is quiet today no effusion skin is intact without erythema  Her knee flexion is improved she has full extension if not hyperextension her knee is stable  There is no specific tenderness  Skin:    General: Skin is warm and dry.  Neurological:     Mental Status: She is alert and oriented to person, place, and time.     Sensory: No sensory deficit.     Coordination: Coordination normal.     Gait: Gait normal.     Deep Tendon Reflexes: Reflexes are normal and symmetric.  Psychiatric:        Mood and Affect: Mood normal.        Behavior: Behavior normal.        Thought Content: Thought content normal.        Judgment: Judgment normal.     Comments: Affect normal      BP (!) 139/91   Pulse 77   Ht 5\' 7"  (1.702 m)   Wt 195 lb (88.5 kg)   BMI 30.54 kg/m     I am going to recommend she supplement this with 500 mg of Tylenol every 6 hours continue the meloxicam  And give her some rescue doses of 20 mg of prednisone may take twice a week as needed for severe symptoms and swelling  Follow-up as needed continue with weight loss efforts and weight control  (Chronic knee pain with intermittent exacerbations, medication management)

## 2021-04-19 NOTE — Patient Instructions (Addendum)
Recommend that you continue with weight control to keep pressure off the knees  Continue the meloxicam daily  Supplement with 500 mg of Tylenol every 6 hours as needed for pain and you have been given 10 mg of prednisone May take 2 tablets twice a week as rescue doses for severe pain or swelling  We also recommend smoking cessation    Follow-up if symptoms worsen   American Journal of Respiratory and Critical Care Medicine, 202(2), e5-e31. FundraisingTactics.gl.[email or web address]">  Health Risks of Smoking Smoking tobacco is very bad for your health. Tobacco smoke contains many toxic chemicals that can damage every part of your body. Secondhand smoke can be harmful to those around you. Tobacco or nicotine use can cause many long-term (chronic) diseases. Smoking is difficult to quit because a chemical in tobacco, called nicotine, causes addiction or dependence. When you smoke and inhale, nicotine is absorbed quickly into the bloodstream through your lungs. Both inhaled and non-inhaled nicotine may be addictive. How can quitting affect me? There are health benefits of quitting smoking. Some benefits happen right away and others take time. Benefits may include:  Blood flow, blood pressure, heart rate, and lung capacity may begin to improve. However, any lung damage that has already occurred cannot be repaired.  Temporary respiratory symptoms, such as nasal congestion and cough, may improve over time.  Your risk of heart disease, stroke, and cancer is reduced.  The overall quality of your health may improve.  You may save money, as you will not spend money on tobacco products and may spend less money on smoking-related health issues. What can increase my risk? Smoking harms nearly every organ in the body. People who smoke tobacco have a shorter life expectancy and an increased risk of many serious medical problems. These include:  More respiratory infections, such as colds and  pneumonia.  Cancer.  Heart disease.  Stroke.  Chronic respiratory diseases.  Delayed wound healing and increased risk of complications during surgery.  Problems with reproduction, pregnancy, and childbirth, such as infertility, early (premature) births, stillbirths, and birth defects. Secondhand smoke exposure to children increases the risk of:  Sudden infant death syndrome (SIDS).  Infections in the nose, throat, or airways (respiratory infections).  Chronic respiratory symptoms.   What actions can I take to quit? Smoking is an addiction that affects both your body and your mind, and long-time habits can be hard to change. Your health care provider can recommend:  Nicotine replacement products, such as patches, gum, and nasal sprays. Use these products only as directed. Do not replace cigarette smoking with electronic cigarettes, which are commonly called e-cigarettes. The safety of e-cigarettes is not known, and some may contain harmful chemicals.  Programs and community resources, which may include group support, education, or talk therapy.  Prescription medicines to help reduce cravings.  A combination of two or more quit methods, which will increase the success of quitting.   Where to find support Follow the recommendations from your health care provider about support groups and other assistance. You can also visit:  [employer]: www.naquitline.org or call 1-800-QUIT-NOW.  U.S. Department of Health and Human Services: www.smokefree.gov  American Lung Association: www.freedomfromsmoking.org  American Heart Association: www.heart.org Where to find more information  Centers for Disease Control and Prevention: FootballExhibition.com.br  World Health Organization: https://castaneda-walker.com/ Summary  Smoking tobacco is very bad for your health. Tobacco smoke contains many toxic chemicals that can damage every part of the body.  Smoking is difficult to quit because  a chemical  in tobacco, called nicotine, causes addiction or dependence.  There are immediate and long-term health benefits of quitting smoking.  A combination of two or more quit methods increases the success of quitting. This information is not intended to replace advice given to you by your health care provider. Make sure you discuss any questions you have with your health care provider. Document Revised: 01/20/2020 Document Reviewed: 01/20/2020 Elsevier Patient Education  2021 Elsevier Inc.  Steps to Quit Smoking Smoking tobacco is the leading cause of preventable death. It can affect almost every organ in the body. Smoking puts you and people around you at risk for many serious, long-lasting (chronic) diseases. Quitting smoking can be hard, but it is one of the best things that you can do for your health. It is never too late to quit. How do I get ready to quit? When you decide to quit smoking, make a plan to help you succeed. Before you quit:  Pick a date to quit. Set a date within the next 2 weeks to give you time to prepare.  Write down the reasons why you are quitting. Keep this list in places where you will see it often.  Tell your family, friends, and co-workers that you are quitting. Their support is important.  Talk with your doctor about the choices that may help you quit.  Find out if your health insurance will pay for these treatments.  Know the people, places, things, and activities that make you want to smoke (triggers). Avoid them. What first steps can I take to quit smoking?  Throw away all cigarettes at home, at work, and in your car.  Throw away the things that you use when you smoke, such as ashtrays and lighters.  Clean your car. Make sure to empty the ashtray.  Clean your home, including curtains and carpets. What can I do to help me quit smoking? Talk with your doctor about taking medicines and seeing a counselor at the same time. You are more likely to succeed when  you do both.  If you are pregnant or breastfeeding, talk with your doctor about counseling or other ways to quit smoking. Do not take medicine to help you quit smoking unless your doctor tells you to do so. To quit smoking: Quit right away  Quit smoking totally, instead of slowly cutting back on how much you smoke over a period of time.  Go to counseling. You are more likely to quit if you go to counseling sessions regularly. Take medicine You may take medicines to help you quit. Some medicines need a prescription, and some you can buy over-the-counter. Some medicines may contain a drug called nicotine to replace the nicotine in cigarettes. Medicines may:  Help you to stop having the desire to smoke (cravings).  Help to stop the problems that come when you stop smoking (withdrawal symptoms). Your doctor may ask you to use:  Nicotine patches, gum, or lozenges.  Nicotine inhalers or sprays.  Non-nicotine medicine that is taken by mouth. Find resources Find resources and other ways to help you quit smoking and remain smoke-free after you quit. These resources are most helpful when you use them often. They include:  Online chats with a Veterinary surgeon.  Phone quitlines.  Printed Materials engineer.  Support groups or group counseling.  Text messaging programs.  Mobile phone apps. Use apps on your mobile phone or tablet that can help you stick to your quit plan. There are many free apps for  mobile phones and tablets as well as websites. Examples include Quit Guide from the [employer] and smokefree.gov   What things can I do to make it easier to quit?  Talk to your family and friends. Ask them to support and encourage you.  Call a phone quitline (1-800-QUIT-NOW), reach out to support groups, or work with a Veterinary surgeon.  Ask people who smoke to not smoke around you.  Avoid places that make you want to smoke, such as: ? Bars. ? Parties. ? Smoke-break areas at work.  Spend time with people  who do not smoke.  Lower the stress in your life. Stress can make you want to smoke. Try these things to help your stress: ? Getting regular exercise. ? Doing deep-breathing exercises. ? Doing yoga. ? Meditating. ? Doing a body scan. To do this, close your eyes, focus on one area of your body at a time from head to toe. Notice which parts of your body are tense. Try to relax the muscles in those areas.   How will I feel when I quit smoking? Day 1 to 3 weeks Within the first 24 hours, you may start to have some problems that come from quitting tobacco. These problems are very bad 2-3 days after you quit, but they do not often last for more than 2-3 weeks. You may get these symptoms:  Mood swings.  Feeling restless, nervous, angry, or annoyed.  Trouble concentrating.  Dizziness.  Strong desire for high-sugar foods and nicotine.  Weight gain.  Trouble pooping (constipation).  Feeling like you may vomit (nausea).  Coughing or a sore throat.  Changes in how the medicines that you take for other issues work in your body.  Depression.  Trouble sleeping (insomnia). Week 3 and afterward After the first 2-3 weeks of quitting, you may start to notice more positive results, such as:  Better sense of smell and taste.  Less coughing and sore throat.  Slower heart rate.  Lower blood pressure.  Clearer skin.  Better breathing.  Fewer sick days. Quitting smoking can be hard. Do not give up if you fail the first time. Some people need to try a few times before they succeed. Do your best to stick to your quit plan, and talk with your doctor if you have any questions or concerns. Summary  Smoking tobacco is the leading cause of preventable death. Quitting smoking can be hard, but it is one of the best things that you can do for your health.  When you decide to quit smoking, make a plan to help you succeed.  Quit smoking right away, not slowly over a period of time.  When you  start quitting, seek help from your doctor, family, or friends. This information is not intended to replace advice given to you by your health care provider. Make sure you discuss any questions you have with your health care provider. Document Revised: 08/30/2019 Document Reviewed: 02/23/2019 Elsevier Patient Education  2021 [employer].

## 2021-04-20 ENCOUNTER — Other Ambulatory Visit (HOSPITAL_COMMUNITY): Payer: Self-pay

## 2021-04-22 ENCOUNTER — Other Ambulatory Visit (HOSPITAL_COMMUNITY): Payer: Self-pay

## 2021-06-11 ENCOUNTER — Other Ambulatory Visit (HOSPITAL_COMMUNITY): Payer: Self-pay

## 2021-06-15 ENCOUNTER — Other Ambulatory Visit (HOSPITAL_COMMUNITY): Payer: Self-pay

## 2021-06-23 ENCOUNTER — Other Ambulatory Visit (HOSPITAL_COMMUNITY): Payer: Self-pay

## 2021-06-30 ENCOUNTER — Other Ambulatory Visit (HOSPITAL_COMMUNITY): Payer: Self-pay

## 2021-07-30 ENCOUNTER — Other Ambulatory Visit: Payer: Self-pay

## 2021-07-30 ENCOUNTER — Other Ambulatory Visit (HOSPITAL_COMMUNITY): Payer: Self-pay

## 2021-07-30 ENCOUNTER — Ambulatory Visit
Admission: EM | Admit: 2021-07-30 | Discharge: 2021-07-30 | Disposition: A | Discharge status: Home / Self Care | Payer: No Typology Code available for payment source | Attending: Emergency Medicine | Admitting: Emergency Medicine

## 2021-07-30 DIAGNOSIS — R030 Elevated blood-pressure reading, without diagnosis of hypertension: Secondary | ICD-10-CM

## 2021-07-30 MED ORDER — NEBIVOLOL HCL 5 MG PO TABS: ORAL_TABLET | Freq: Every day | ORAL | 0 refills | Status: DC

## 2021-07-30 MED ORDER — NEBIVOLOL HCL 5 MG PO TABS
ORAL_TABLET | Freq: Every day | ORAL | 0 refills | Status: DC
  Filled 2021-07-30: qty 20, 20d supply, fill #0

## 2021-07-30 NOTE — ED Provider Notes (Signed)
[facility]   [id number] 07/30/21 Arrival Time: 1142  CC: blood pressure medication refill  SUBJECTIVE:  Julie Barton is a 50 y.o. female who presents for blood pressure medication refill.  Hx of HTN x several years.  States blood pressure on average is 160/110.  Takes bystolic 5 mg.  Does have a PCP, but unable to get in with them. Complains of HA.  Denies vision changes, dizziness, lightheadedness, chest pain, shortness of breath, numbness or tingling in extremities, abdominal pain, changes in bowel or bladder habits.      ROS: As per HPI.  All other pertinent ROS negative.     Past Medical History:  Diagnosis Date   Allergic rhinitis    Arthritis    Cervical polyp    Constipation    Migraines    Recurrent low back pain    bulging disk/ arthritis / spinal stenosis    Past Surgical History:  Procedure Laterality Date   CHOLECYSTECTOMY     endometrial ablation-- for DUB     TUBAL LIGATION     No Known Allergies No current facility-administered medications on file prior to encounter.   Current Outpatient Medications on File Prior to Encounter  Medication Sig Dispense Refill   acetaminophen (TYLENOL) 325 MG tablet Take 650 mg by mouth. Take 2 tablets by mouth daily as needed     Aspirin-Salicylamide-Caffeine 325-95-16 MG TABS Take by mouth daily.     Chlorpheniramine Maleate 12 MG CP24 Take 1 capsule (12 mg total) by mouth daily. 30 each 3   diclofenac sodium (VOLTAREN) 1 % GEL Apply 1 application topically 4 (four) times daily. 5 Tube 3   fluticasone (FLONASE) 50 MCG/ACT nasal spray Place 2 sprays into the nose daily. 16 g 3   ibuprofen (ADVIL,MOTRIN) 200 MG tablet Take 200 mg by mouth every 6 (six) hours as needed.     meloxicam (MOBIC) 7.5 MG tablet TAKE 1 TABLET (7.5 MG TOTAL) BY MOUTH DAILY. 30 tablet 5   naproxen sodium (ANAPROX) 220 MG tablet Take 220 mg by mouth 2 (two) times daily with a meal. Take one tablet by mouth two times a day as needed for cyst  under arm     ofloxacin (OCUFLOX) 0.3 % ophthalmic solution Place 1 drop into the right eye 4 (four) times daily. 5 mL 0   prednisoLONE acetate (PRED FORTE) 1 % ophthalmic suspension Place 1 drop into the right eye 4 (four) times daily. 5 mL 0   predniSONE (DELTASONE) 10 MG tablet Take 2 tablets by mouth up to 2 times a week as needed for severe swelling 60 tablet 0   [DISCONTINUED] cetirizine (ZYRTEC) 10 MG tablet Take 10 mg by mouth daily.     [DISCONTINUED] fexofenadine (ALLEGRA) 180 MG tablet Take 180 mg by mouth daily.     [DISCONTINUED] Loratadine (CLARITIN PO) Take by mouth.     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    Tobacco Use   Smoking status: Every Day    Packs/day: 0.50    Types: Cigarettes   Smokeless tobacco: Never  Substance and Sexual Activity   Alcohol use: No   Drug use: No   Sexual activity: Not on file  Other Topics Concern   Not on file  Social History Narrative   Not on file   Social Determinants of Health   Financial Resource Strain: Not on file  Food Insecurity: Not on file  Transportation Needs: Not on file  Physical Activity: Not on file  Stress: Not on file  Social Connections: Not on file  Intimate Partner Violence: Not on file   Family History  Problem Relation Age of Onset   Arthritis Other    Diabetes Other    Eczema Other     OBJECTIVE:  Vitals:   07/30/21 1151  BP: (!) 162/110  Pulse: 87  Resp: 20  Temp: 98.5 F (36.9 C)  SpO2: 97%    General appearance: alert; no distress Eyes: PERRLA; EOMI HENT: normocephalic; atraumatic Neck: supple with FROM Lungs: clear to auscultation bilaterally Heart: regular rate and rhythm.  Radial pulses 2+ symmetrical bilaterally Extremities: no edema;  symmetrical with no gross deformities Skin: warm and dry Neurologic: CN 2-12 grossly intact; ambulates without difficulty Psychological: alert and cooperative; normal mood and affect   ASSESSMENT & PLAN:  1. Elevated blood pressure reading     Meds ordered this encounter  Medications   DISCONTD: nebivolol (BYSTOLIC) 5 MG tablet    Sig: TAKE 1 TABLET BY MOUTH ONCE A DAY    Dispense:  20 tablet    Refill:  0    Order Specific Question:   Supervising Provider    Answer:   Eustace Moore [[id number]]   nebivolol (BYSTOLIC) 5 MG tablet    Sig: TAKE 1 TABLET BY MOUTH ONCE A DAY    Dispense:  20 tablet    Refill:  0    Order Specific Question:   Supervising Provider    Answer:   Eustace Moore [[id number]]   Bystolic 5 mg prescribed Please continue to monitor blood pressure at home and keep a log Eat a well balanced diet of fruits, vegetables and lean meats.  Avoid foods high in fat and salt Drink water.  At least half your body weight in ounces Exercise for at least 30 minutes daily Follow up with PCP Return or go to the ED if you have any new or worsening symptoms such as vision changes, fatigue, dizziness, chest pain, shortness of breath, nausea, swelling in your hands or feet, urinary symptoms, etc...   Reviewed expectations re: course of current medical issues. Questions answered. Outlined signs and symptoms indicating need for more acute intervention. Patient verbalized understanding. After Visit Summary given.    Rennis Harding, PA-C 07/30/21 1221

## 2021-07-30 NOTE — ED Triage Notes (Signed)
Pt states her BP has been high and she is out  of bp medication

## 2021-07-30 NOTE — Discharge Instructions (Signed)
Bystolic 5 mg prescribed Please continue to monitor blood pressure at home and keep a log Eat a well balanced diet of fruits, vegetables and lean meats.  Avoid foods high in fat and salt Drink water.  At least half your body weight in ounces Exercise for at least 30 minutes daily Follow up with PCP Return or go to the ED if you have any new or worsening symptoms such as vision changes, fatigue, dizziness, chest pain, shortness of breath, nausea, swelling in your hands or feet, urinary symptoms, etc..Marland Kitchen

## 2021-09-22 ENCOUNTER — Other Ambulatory Visit (HOSPITAL_COMMUNITY): Payer: Self-pay

## 2021-09-22 MED ORDER — NEBIVOLOL HCL 5 MG PO TABS
5.0000 mg | ORAL_TABLET | Freq: Every day | ORAL | 0 refills | Status: DC
  Filled 2021-09-22: qty 90, 90d supply, fill #0

## 2021-11-04 ENCOUNTER — Other Ambulatory Visit (HOSPITAL_COMMUNITY): Payer: Self-pay

## 2021-11-04 ENCOUNTER — Other Ambulatory Visit (HOSPITAL_COMMUNITY): Payer: Self-pay | Admitting: Family Medicine

## 2021-11-04 ENCOUNTER — Other Ambulatory Visit: Payer: Self-pay | Admitting: Family Medicine

## 2021-11-04 DIAGNOSIS — R0989 Other specified symptoms and signs involving the circulatory and respiratory systems: Secondary | ICD-10-CM

## 2021-11-04 MED ORDER — TRIAMCINOLONE ACETONIDE 0.1 % EX CREA
TOPICAL_CREAM | CUTANEOUS | 0 refills | Status: AC
  Filled 2021-11-04: qty 30, 15d supply, fill #0

## 2021-11-04 MED ORDER — PANTOPRAZOLE SODIUM 40 MG PO TBEC
40.0000 mg | DELAYED_RELEASE_TABLET | Freq: Every day | ORAL | 2 refills | Status: AC
  Filled 2021-11-04: qty 30, 30d supply, fill #0

## 2022-01-11 ENCOUNTER — Other Ambulatory Visit (HOSPITAL_COMMUNITY): Payer: Self-pay

## 2022-01-11 MED ORDER — NEBIVOLOL HCL 5 MG PO TABS
5.0000 mg | ORAL_TABLET | Freq: Every day | ORAL | 2 refills | Status: DC
  Filled 2022-01-11: qty 90, 90d supply, fill #0
  Filled 2022-05-12: qty 90, 90d supply, fill #1
  Filled 2022-09-07: qty 30, 30d supply, fill #2
  Filled 2022-09-07: qty 60, 60d supply, fill #2

## 2022-05-12 ENCOUNTER — Other Ambulatory Visit (HOSPITAL_COMMUNITY): Payer: Self-pay

## 2022-09-07 ENCOUNTER — Other Ambulatory Visit (HOSPITAL_COMMUNITY): Payer: Self-pay

## 2022-09-08 ENCOUNTER — Other Ambulatory Visit (HOSPITAL_COMMUNITY): Payer: Self-pay

## 2022-09-09 ENCOUNTER — Other Ambulatory Visit (HOSPITAL_COMMUNITY): Payer: Self-pay

## 2022-09-27 ENCOUNTER — Other Ambulatory Visit (HOSPITAL_COMMUNITY): Payer: Self-pay

## 2022-12-30 ENCOUNTER — Other Ambulatory Visit (HOSPITAL_COMMUNITY): Payer: Self-pay

## 2023-01-12 ENCOUNTER — Other Ambulatory Visit: Payer: Self-pay

## 2023-01-12 ENCOUNTER — Other Ambulatory Visit (HOSPITAL_COMMUNITY): Payer: Self-pay

## 2023-01-12 MED ORDER — NEBIVOLOL HCL 5 MG PO TABS
5.0000 mg | ORAL_TABLET | Freq: Every day | ORAL | 2 refills | Status: AC
  Filled 2023-01-12 (×4): qty 90, 90d supply, fill #0
  Filled 2023-07-10 – 2023-07-14 (×2): qty 90, 90d supply, fill #1
  Filled 2023-11-23: qty 90, 90d supply, fill #2
  Filled 2023-11-24: qty 90, 90d supply, fill #0

## 2023-01-31 ENCOUNTER — Other Ambulatory Visit (HOSPITAL_COMMUNITY): Payer: Self-pay

## 2023-02-17 ENCOUNTER — Other Ambulatory Visit (HOSPITAL_COMMUNITY): Payer: Self-pay

## 2023-07-10 ENCOUNTER — Other Ambulatory Visit (HOSPITAL_COMMUNITY): Payer: Self-pay

## 2023-07-14 ENCOUNTER — Other Ambulatory Visit (HOSPITAL_COMMUNITY): Payer: Self-pay

## 2023-07-21 ENCOUNTER — Other Ambulatory Visit (HOSPITAL_COMMUNITY): Payer: Self-pay

## 2023-07-26 ENCOUNTER — Other Ambulatory Visit (HOSPITAL_BASED_OUTPATIENT_CLINIC_OR_DEPARTMENT_OTHER): Payer: Self-pay

## 2023-11-24 ENCOUNTER — Other Ambulatory Visit (HOSPITAL_COMMUNITY): Payer: Self-pay

## 2023-11-24 ENCOUNTER — Other Ambulatory Visit: Payer: Self-pay

## 2024-02-19 ENCOUNTER — Other Ambulatory Visit (HOSPITAL_COMMUNITY): Payer: Self-pay

## 2024-02-19 ENCOUNTER — Ambulatory Visit
Admission: EM | Admit: 2024-02-19 | Discharge: 2024-02-19 | Disposition: A | Discharge status: Home / Self Care | Attending: Family Medicine | Admitting: Family Medicine

## 2024-02-19 DIAGNOSIS — R52 Pain, unspecified: Secondary | ICD-10-CM | POA: Diagnosis not present

## 2024-02-19 DIAGNOSIS — J069 Acute upper respiratory infection, unspecified: Secondary | ICD-10-CM

## 2024-02-19 DIAGNOSIS — R062 Wheezing: Secondary | ICD-10-CM | POA: Diagnosis not present

## 2024-02-19 LAB — POCT INFLUENZA A/B
Influenza A, POC: NEGATIVE
Influenza B, POC: NEGATIVE

## 2024-02-19 MED ORDER — ALBUTEROL SULFATE HFA 108 (90 BASE) MCG/ACT IN AERS
2.0000 | INHALATION_SPRAY | RESPIRATORY_TRACT | 0 refills | Status: AC | PRN
  Filled 2024-02-19: qty 6.7, 30d supply, fill #0
  Filled 2024-02-19: qty 6.7, 17d supply, fill #0

## 2024-02-19 MED ORDER — PROMETHAZINE-DM 6.25-15 MG/5ML PO SYRP
5.0000 mL | ORAL_SOLUTION | Freq: Four times a day (QID) | ORAL | 0 refills | Status: AC | PRN
  Filled 2024-02-19 (×2): qty 100, 5d supply, fill #0

## 2024-02-19 NOTE — Discharge Instructions (Signed)
 Your COVID and flu test was negative today.  I have sent over some cough syrup and an albuterol inhaler to help with your cough and wheezing.  You may also take over-the-counter cold and congestion medications, ibuprofen and Tylenol as needed, Flonase, Mucinex.

## 2024-02-19 NOTE — ED Triage Notes (Signed)
 Pt reports cough, congestion, joint pain, headache,  SOB, body ache, low grade fever, x 1 day states her job has recommended she come to be tested for the flu states she tested for COVID while at work this morning and it was negative.

## 2024-02-19 NOTE — ED Provider Notes (Signed)
 [facility]    CSN: [id number] Arrival date & time: 02/19/24  1550      History   Chief Complaint No chief complaint on file.   HPI Julie Barton is a 53 y.o. female.   Presenting today with 1 day history of cough, congestion, generalized bodyaches, headache, shortness of breath, low-grade fever.  Denies chest pain, shortness of breath, abdominal pain, vomiting, diarrhea.  So far not trying anything over-the-counter for symptoms.    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      Home Medications    Prior to Admission medications   Medication Sig Start Date End Date Taking? Authorizing Provider  albuterol (VENTOLIN HFA) 108 (90 Base) MCG/ACT inhaler Inhale 2 puffs into the lungs every 4 (four) hours as needed. 02/19/24  Yes Particia Nearing, PA-C  promethazine-dextromethorphan (PROMETHAZINE-DM) 6.25-15 MG/5ML syrup Take 5 mLs by mouth 4 (four) times daily as needed. 02/19/24  Yes Particia Nearing, PA-C  acetaminophen (TYLENOL) 325 MG tablet Take 650 mg by mouth. Take 2 tablets by mouth daily as needed    [provider]  Aspirin-Salicylamide-Caffeine 325-95-16 MG TABS Take by mouth daily.    [provider]  Chlorpheniramine Maleate 12 MG CP24 Take 1  capsule (12 mg total) by mouth daily. 05/06/12   Reuben Likes, MD  diclofenac sodium (VOLTAREN) 1 % GEL Apply 1 application topically 4 (four) times daily. 04/19/11   Vickki Hearing, MD  fluticasone (FLONASE) 50 MCG/ACT nasal spray Place 2 sprays into the nose daily. 05/06/12 05/06/13  Reuben Likes, MD  ibuprofen (ADVIL,MOTRIN) 200 MG tablet Take 200 mg by mouth every 6 (six) hours as needed.    [provider]  naproxen sodium (ANAPROX) 220 MG tablet Take 220 mg by mouth 2 (two) times daily with a meal. Take one tablet by mouth two times a day as needed for cyst under arm    [provider]  nebivolol (BYSTOLIC) 5 MG tablet Take 1 tablet (5 mg total) by mouth daily. 01/12/23     ofloxacin (OCUFLOX) 0.3 % ophthalmic solution Place 1 drop into the right eye 4 (four) times daily. 06/05/20   Avegno, Zachery Dakins, FNP  pantoprazole (PROTONIX) 40 MG tablet Take 1 tablet (40 mg total) by mouth daily. 11/04/21     prednisoLONE acetate (PRED FORTE) 1 % ophthalmic suspension Place 1 drop into the right eye 4 (four) times daily. 06/05/20   Avegno, Zachery Dakins, FNP  predniSONE (DELTASONE) 10 MG tablet Take 2 tablets by mouth up to 2 times a week as needed for severe swelling 04/19/21   Vickki Hearing, MD  triamcinolone cream (TRIDERM) 0.1 %  APPLY A THIN LAYER TO THE AFFECTED AREA(S) BY TOPICAL ROUTE 2 TIMES PER DAY 11/04/21     cetirizine (ZYRTEC) 10 MG tablet Take 10 mg by mouth daily.  05/06/12  [provider]  fexofenadine (ALLEGRA) 180 MG tablet Take 180 mg by mouth daily.  05/06/12  [provider]  Loratadine (CLARITIN PO) Take by mouth.  05/06/12  [provider]    Family History Family History  Problem Relation Age of Onset   Arthritis Other    Diabetes Other    Eczema Other     Social History Social History   Tobacco Use   Smoking status: Every Day    Current packs/day: 0.50    Types: Cigarettes   Smokeless tobacco: Never  Substance Use  Topics   Alcohol use: No   Drug use: No     Allergies   Patient has no known allergies.   Review of Systems Review of Systems PER HPI  Physical Exam Triage Vital Signs ED Triage Vitals  Encounter Vitals Group     BP 02/19/24 1642 (!) 154/94     Systolic BP Percentile --      Diastolic BP Percentile --      Pulse Rate 02/19/24 1642 73     Resp 02/19/24 1642 16     Temp 02/19/24 1642 98.5 F (36.9 C)     Temp Source 02/19/24 1642 Oral     SpO2 02/19/24 1642 97 %     Weight --      Height --      Head Circumference --      Peak Flow --      Pain Score 02/19/24 1646 5     Pain Loc --      Pain Education --      Exclude from Growth Chart --    No data found.  Updated Vital Signs BP (!) 154/94 (BP Location: Right Arm)   Pulse 73   Temp 98.5 F (36.9 C) (Oral)   Resp 16   SpO2 97%   Visual Acuity Right Eye Distance:   Left Eye Distance:   Bilateral Distance:    Right Eye Near:   Left Eye Near:    Bilateral Near:     Physical Exam Vitals and nursing note reviewed.  Constitutional:      Appearance: Normal appearance.  HENT:     Head: Atraumatic.     Right Ear: Tympanic membrane and external ear normal.     Left Ear: Tympanic membrane and external ear normal.     Nose: Rhinorrhea present.     Mouth/Throat:     Mouth: Mucous membranes are moist.     Pharynx: Posterior oropharyngeal erythema present.  Eyes:     Extraocular Movements: Extraocular movements intact.     Conjunctiva/sclera: Conjunctivae normal.  Cardiovascular:     Rate and Rhythm: Normal rate and regular rhythm.     Heart sounds: Normal heart sounds.  Pulmonary:     Effort: Pulmonary effort is normal.     Breath sounds: Normal breath sounds. No wheezing.  Musculoskeletal:        General: Normal range of motion.     Cervical back: Normal range of motion and neck supple.  Skin:    General: Skin is warm and dry.  Neurological:     Mental Status: She is alert and oriented to person,  place, and time.  Psychiatric:        Mood and Affect:  Mood normal.        Thought Content: Thought content normal.      UC Treatments / Results  Labs (all labs ordered are listed, but only abnormal results are displayed) Labs Reviewed  POCT INFLUENZA A/B    EKG   Radiology No results found.  Procedures Procedures (including critical care time)  Medications Ordered in UC Medications - No data to display  Initial Impression / Assessment and Plan / UC Course  I have reviewed the triage vital signs and the nursing notes.  Pertinent labs & imaging results that were available during my care of the patient were reviewed by me and considered in my medical decision making (see chart for details).     Overall vital signs and exam reassuring and suggestive of a viral respiratory infection.  Home COVID test was negative, rapid flu here in clinic negative.  Discussed Phenergan DM, albuterol inhaler, supportive over-the-counter medications and home care.  Return for worsening symptoms.  Final Clinical Impressions(s) / UC Diagnoses   Final diagnoses:  Viral URI with cough  Wheezing  Body aches     Discharge Instructions      Your COVID and flu test was negative today.  I have sent over some cough syrup and an albuterol inhaler to help with your cough and wheezing.  You may also take over-the-counter cold and congestion medications, ibuprofen and Tylenol as needed, Flonase, Mucinex.    ED Prescriptions     Medication Sig Dispense Auth. Provider   promethazine-dextromethorphan (PROMETHAZINE-DM) 6.25-15 MG/5ML syrup Take 5 mLs by mouth 4 (four) times daily as needed. 100 mL Particia Nearing, PA-C   albuterol (VENTOLIN HFA) 108 (90 Base) MCG/ACT inhaler Inhale 2 puffs into the lungs every 4 (four) hours as needed. 6.7 g Particia Nearing, [place]      PDMP not reviewed this encounter.   Particia Nearing, [place] 02/19/24 1735

## 2024-02-20 ENCOUNTER — Other Ambulatory Visit: Payer: Self-pay

## 2024-02-20 ENCOUNTER — Other Ambulatory Visit (HOSPITAL_COMMUNITY): Payer: Self-pay
